# Patient Record
Sex: Male | Born: 1935 | Race: White | Hispanic: No | Marital: Married | Smoking: Never smoker
Health system: Southern US, Community
[De-identification: ages and names within clinical notes are randomized; demographics above are authoritative.]

## PROBLEM LIST (undated history)

## (undated) DIAGNOSIS — I509 Heart failure, unspecified: Secondary | ICD-10-CM

## (undated) DIAGNOSIS — F0390 Unspecified dementia without behavioral disturbance: Secondary | ICD-10-CM

## (undated) DIAGNOSIS — I2510 Atherosclerotic heart disease of native coronary artery without angina pectoris: Secondary | ICD-10-CM

## (undated) DIAGNOSIS — E119 Type 2 diabetes mellitus without complications: Secondary | ICD-10-CM

## (undated) DIAGNOSIS — I252 Old myocardial infarction: Secondary | ICD-10-CM

## (undated) HISTORY — PX: CORONARY ARTERY BYPASS GRAFT: SHX141

---

## 2013-05-27 ENCOUNTER — Emergency Department: Payer: Self-pay | Admitting: Emergency Medicine

## 2013-05-27 LAB — BASIC METABOLIC PANEL
ANION GAP: 7 (ref 7–16)
BUN: 45 mg/dL — ABNORMAL HIGH (ref 7–18)
CHLORIDE: 98 mmol/L (ref 98–107)
CO2: 26 mmol/L (ref 21–32)
CREATININE: 1.55 mg/dL — AB (ref 0.60–1.30)
Calcium, Total: 9.6 mg/dL (ref 8.5–10.1)
Glucose: 75 mg/dL (ref 65–99)
Osmolality: 273 (ref 275–301)
POTASSIUM: 4.6 mmol/L (ref 3.5–5.1)
Sodium: 131 mmol/L — ABNORMAL LOW (ref 136–145)

## 2013-05-27 LAB — CBC WITH DIFFERENTIAL/PLATELET
BASOS PCT: 0.3 %
Basophil #: 0 10*3/uL (ref 0.0–0.1)
EOS PCT: 1.5 %
Eosinophil #: 0.1 10*3/uL (ref 0.0–0.7)
HCT: 37.3 % — ABNORMAL LOW (ref 40.0–52.0)
HGB: 12.7 g/dL — AB (ref 13.0–18.0)
Lymphocyte #: 1.6 10*3/uL (ref 1.0–3.6)
Lymphocyte %: 17.8 %
MCH: 32.6 pg (ref 26.0–34.0)
MCHC: 33.9 g/dL (ref 32.0–36.0)
MCV: 96 fL (ref 80–100)
Monocyte #: 1 x10 3/mm (ref 0.2–1.0)
Monocyte %: 11.5 %
NEUTROS ABS: 6.2 10*3/uL (ref 1.4–6.5)
NEUTROS PCT: 68.9 %
PLATELETS: 285 10*3/uL (ref 150–440)
RBC: 3.88 10*6/uL — ABNORMAL LOW (ref 4.40–5.90)
RDW: 12.8 % (ref 11.5–14.5)
WBC: 9.1 10*3/uL (ref 3.8–10.6)

## 2013-05-27 LAB — URINALYSIS, COMPLETE
Bacteria: NONE SEEN
Bilirubin,UR: NEGATIVE
Blood: NEGATIVE
Glucose,UR: NEGATIVE mg/dL (ref 0–75)
Hyaline Cast: 3
Ketone: NEGATIVE
Leukocyte Esterase: NEGATIVE
Nitrite: NEGATIVE
Ph: 5 (ref 4.5–8.0)
Protein: NEGATIVE
RBC,UR: <1 /HPF (ref 0–5)
Specific Gravity: 1.016 (ref 1.003–1.030)
WBC UR: NONE SEEN /HPF (ref 0–5)

## 2013-05-27 LAB — TROPONIN I

## 2013-05-30 ENCOUNTER — Emergency Department: Payer: Self-pay | Admitting: Emergency Medicine

## 2013-05-30 LAB — COMPREHENSIVE METABOLIC PANEL
ALBUMIN: 3.5 g/dL (ref 3.4–5.0)
ALK PHOS: 55 U/L
ALT: 34 U/L (ref 12–78)
ANION GAP: 5 — AB (ref 7–16)
AST: 23 U/L (ref 15–37)
BUN: 40 mg/dL — ABNORMAL HIGH (ref 7–18)
Bilirubin,Total: 0.4 mg/dL (ref 0.2–1.0)
CALCIUM: 9.7 mg/dL (ref 8.5–10.1)
Chloride: 97 mmol/L — ABNORMAL LOW (ref 98–107)
Co2: 28 mmol/L (ref 21–32)
Creatinine: 1.58 mg/dL — ABNORMAL HIGH (ref 0.60–1.30)
EGFR (African American): 47 — ABNORMAL LOW
EGFR (Non-African Amer.): 40 — ABNORMAL LOW
GLUCOSE: 120 mg/dL — AB (ref 65–99)
OSMOLALITY: 272 (ref 275–301)
Potassium: 5 mmol/L (ref 3.5–5.1)
Sodium: 130 mmol/L — ABNORMAL LOW (ref 136–145)
Total Protein: 7.5 g/dL (ref 6.4–8.2)

## 2013-05-30 LAB — CBC
HCT: 38.8 % — AB (ref 40.0–52.0)
HGB: 12.9 g/dL — AB (ref 13.0–18.0)
MCH: 31.9 pg (ref 26.0–34.0)
MCHC: 33.1 g/dL (ref 32.0–36.0)
MCV: 96 fL (ref 80–100)
PLATELETS: 287 10*3/uL (ref 150–440)
RBC: 4.04 10*6/uL — ABNORMAL LOW (ref 4.40–5.90)
RDW: 13 % (ref 11.5–14.5)
WBC: 8.2 10*3/uL (ref 3.8–10.6)

## 2013-05-30 LAB — TROPONIN I: Troponin-I: <0.02 ng/mL

## 2014-05-23 ENCOUNTER — Ambulatory Visit: Payer: Self-pay | Admitting: Unknown Physician Specialty

## 2014-08-11 ENCOUNTER — Other Ambulatory Visit: Payer: Self-pay | Admitting: Unknown Physician Specialty

## 2014-08-11 DIAGNOSIS — E041 Nontoxic single thyroid nodule: Secondary | ICD-10-CM

## 2015-01-30 ENCOUNTER — Other Ambulatory Visit: Payer: Self-pay | Admitting: Neurology

## 2015-01-30 DIAGNOSIS — R413 Other amnesia: Secondary | ICD-10-CM

## 2015-04-03 ENCOUNTER — Ambulatory Visit
Admission: RE | Admit: 2015-04-03 | Discharge: 2015-04-03 | Disposition: A | Discharge status: Home / Self Care | Payer: Medicare Other | Source: Ambulatory Visit | Attending: Unknown Physician Specialty | Admitting: Unknown Physician Specialty

## 2015-04-03 DIAGNOSIS — E041 Nontoxic single thyroid nodule: Secondary | ICD-10-CM | POA: Diagnosis present

## 2015-04-03 DIAGNOSIS — E042 Nontoxic multinodular goiter: Secondary | ICD-10-CM | POA: Insufficient documentation

## 2015-04-10 ENCOUNTER — Other Ambulatory Visit: Payer: Self-pay | Admitting: Unknown Physician Specialty

## 2015-04-10 DIAGNOSIS — E041 Nontoxic single thyroid nodule: Secondary | ICD-10-CM

## 2015-10-09 ENCOUNTER — Ambulatory Visit: Payer: Medicare Other

## 2016-01-11 ENCOUNTER — Emergency Department: Payer: Medicare Other

## 2016-01-11 ENCOUNTER — Observation Stay
Admission: EM | Admit: 2016-01-11 | Discharge: 2016-01-13 | Disposition: A | Discharge status: Home / Self Care | Payer: Medicare Other | Attending: Internal Medicine | Admitting: Internal Medicine

## 2016-01-11 DIAGNOSIS — Z882 Allergy status to sulfonamides status: Secondary | ICD-10-CM | POA: Diagnosis not present

## 2016-01-11 DIAGNOSIS — I739 Peripheral vascular disease, unspecified: Secondary | ICD-10-CM | POA: Insufficient documentation

## 2016-01-11 DIAGNOSIS — Z951 Presence of aortocoronary bypass graft: Secondary | ICD-10-CM | POA: Diagnosis not present

## 2016-01-11 DIAGNOSIS — L03116 Cellulitis of left lower limb: Secondary | ICD-10-CM | POA: Insufficient documentation

## 2016-01-11 DIAGNOSIS — I081 Rheumatic disorders of both mitral and tricuspid valves: Secondary | ICD-10-CM | POA: Insufficient documentation

## 2016-01-11 DIAGNOSIS — Z8249 Family history of ischemic heart disease and other diseases of the circulatory system: Secondary | ICD-10-CM | POA: Diagnosis not present

## 2016-01-11 DIAGNOSIS — L03112 Cellulitis of left axilla: Secondary | ICD-10-CM | POA: Diagnosis not present

## 2016-01-11 DIAGNOSIS — I110 Hypertensive heart disease with heart failure: Secondary | ICD-10-CM | POA: Insufficient documentation

## 2016-01-11 DIAGNOSIS — E119 Type 2 diabetes mellitus without complications: Secondary | ICD-10-CM | POA: Insufficient documentation

## 2016-01-11 DIAGNOSIS — J189 Pneumonia, unspecified organism: Secondary | ICD-10-CM | POA: Diagnosis not present

## 2016-01-11 DIAGNOSIS — R778 Other specified abnormalities of plasma proteins: Secondary | ICD-10-CM | POA: Diagnosis not present

## 2016-01-11 DIAGNOSIS — W19XXXA Unspecified fall, initial encounter: Secondary | ICD-10-CM | POA: Diagnosis present

## 2016-01-11 DIAGNOSIS — I5022 Chronic systolic (congestive) heart failure: Secondary | ICD-10-CM | POA: Insufficient documentation

## 2016-01-11 DIAGNOSIS — L0390 Cellulitis, unspecified: Secondary | ICD-10-CM | POA: Diagnosis present

## 2016-01-11 DIAGNOSIS — Z23 Encounter for immunization: Secondary | ICD-10-CM | POA: Insufficient documentation

## 2016-01-11 DIAGNOSIS — G309 Alzheimer's disease, unspecified: Secondary | ICD-10-CM | POA: Insufficient documentation

## 2016-01-11 DIAGNOSIS — F0280 Dementia in other diseases classified elsewhere without behavioral disturbance: Secondary | ICD-10-CM | POA: Diagnosis not present

## 2016-01-11 DIAGNOSIS — I252 Old myocardial infarction: Secondary | ICD-10-CM | POA: Diagnosis not present

## 2016-01-11 DIAGNOSIS — E785 Hyperlipidemia, unspecified: Secondary | ICD-10-CM | POA: Diagnosis not present

## 2016-01-11 DIAGNOSIS — R9431 Abnormal electrocardiogram [ECG] [EKG]: Secondary | ICD-10-CM

## 2016-01-11 DIAGNOSIS — W010XXA Fall on same level from slipping, tripping and stumbling without subsequent striking against object, initial encounter: Secondary | ICD-10-CM | POA: Diagnosis not present

## 2016-01-11 DIAGNOSIS — I2510 Atherosclerotic heart disease of native coronary artery without angina pectoris: Secondary | ICD-10-CM | POA: Diagnosis not present

## 2016-01-11 HISTORY — DX: Type 2 diabetes mellitus without complications: E11.9

## 2016-01-11 HISTORY — DX: Unspecified dementia, unspecified severity, without behavioral disturbance, psychotic disturbance, mood disturbance, and anxiety: F03.90

## 2016-01-11 HISTORY — DX: Atherosclerotic heart disease of native coronary artery without angina pectoris: I25.10

## 2016-01-11 HISTORY — DX: Old myocardial infarction: I25.2

## 2016-01-11 LAB — CBC
HCT: 41.6 % (ref 40.0–52.0)
Hemoglobin: 14.3 g/dL (ref 13.0–18.0)
MCH: 32.8 pg (ref 26.0–34.0)
MCHC: 34.5 g/dL (ref 32.0–36.0)
MCV: 95.1 fL (ref 80.0–100.0)
PLATELETS: 171 10*3/uL (ref 150–440)
RBC: 4.38 MIL/uL — ABNORMAL LOW (ref 4.40–5.90)
RDW: 13 % (ref 11.5–14.5)
WBC: 13.3 10*3/uL — AB (ref 3.8–10.6)

## 2016-01-11 LAB — TROPONIN I
TROPONIN I: 0.03 ng/mL — AB (ref <0.03)
Troponin I: 0.03 ng/mL (ref <0.03)
Troponin I: 0.03 ng/mL (ref <0.03)

## 2016-01-11 LAB — URINALYSIS COMPLETE WITH MICROSCOPIC (ARMC ONLY)
BILIRUBIN URINE: NEGATIVE
Bacteria, UA: NONE SEEN
HGB URINE DIPSTICK: NEGATIVE
Ketones, ur: NEGATIVE mg/dL
LEUKOCYTES UA: NEGATIVE
NITRITE: NEGATIVE
Protein, ur: NEGATIVE mg/dL
RBC / HPF: NONE SEEN RBC/hpf (ref 0–5)
SPECIFIC GRAVITY, URINE: 1.01 (ref 1.005–1.030)
Squamous Epithelial / LPF: NONE SEEN
pH: 6 (ref 5.0–8.0)

## 2016-01-11 LAB — BASIC METABOLIC PANEL
Anion gap: 13 (ref 5–15)
BUN: 17 mg/dL (ref 6–20)
CALCIUM: 9.9 mg/dL (ref 8.9–10.3)
CO2: 24 mmol/L (ref 22–32)
CREATININE: 1.05 mg/dL (ref 0.61–1.24)
Chloride: 97 mmol/L — ABNORMAL LOW (ref 101–111)
GFR calc Af Amer: >60 mL/min (ref >60)
Glucose, Bld: 326 mg/dL — ABNORMAL HIGH (ref 65–99)
POTASSIUM: 3.6 mmol/L (ref 3.5–5.1)
SODIUM: 134 mmol/L — AB (ref 135–145)

## 2016-01-11 LAB — GLUCOSE, CAPILLARY
GLUCOSE-CAPILLARY: 214 mg/dL — AB (ref 65–99)
Glucose-Capillary: 291 mg/dL — ABNORMAL HIGH (ref 65–99)

## 2016-01-11 LAB — PROTIME-INR
INR: 1.04
PROTHROMBIN TIME: 13.6 s (ref 11.4–15.2)

## 2016-01-11 LAB — BRAIN NATRIURETIC PEPTIDE: B Natriuretic Peptide: 218 pg/mL — ABNORMAL HIGH (ref 0.0–100.0)

## 2016-01-11 LAB — HEPARIN LEVEL (UNFRACTIONATED): Heparin Unfractionated: <0.1 IU/mL — ABNORMAL LOW (ref 0.30–0.70)

## 2016-01-11 LAB — APTT: aPTT: 29 seconds (ref 24–36)

## 2016-01-11 MED ORDER — FINASTERIDE 5 MG PO TABS
5.0000 mg | ORAL_TABLET | Freq: Every day | ORAL | Status: DC
  Administered 2016-01-12 – 2016-01-13 (×2): 5 mg via ORAL
  Filled 2016-01-11 (×2): qty 1

## 2016-01-11 MED ORDER — METFORMIN HCL ER 500 MG PO TB24
1000.0000 mg | ORAL_TABLET | Freq: Every evening | ORAL | Status: DC
  Administered 2016-01-11 – 2016-01-12 (×2): 1000 mg via ORAL
  Filled 2016-01-11 (×2): qty 2

## 2016-01-11 MED ORDER — FUROSEMIDE 40 MG PO TABS
20.0000 mg | ORAL_TABLET | Freq: Every day | ORAL | Status: DC
  Administered 2016-01-12 – 2016-01-13 (×2): 20 mg via ORAL
  Filled 2016-01-11 (×2): qty 1

## 2016-01-11 MED ORDER — DOXYCYCLINE HYCLATE 100 MG PO TABS
100.0000 mg | ORAL_TABLET | Freq: Two times a day (BID) | ORAL | Status: DC
  Administered 2016-01-11 – 2016-01-13 (×3): 100 mg via ORAL
  Filled 2016-01-11 (×4): qty 1

## 2016-01-11 MED ORDER — METFORMIN HCL ER 500 MG PO TB24
500.0000 mg | ORAL_TABLET | Freq: Every day | ORAL | Status: DC
  Administered 2016-01-12 – 2016-01-13 (×2): 500 mg via ORAL
  Filled 2016-01-11 (×2): qty 1

## 2016-01-11 MED ORDER — CLINDAMYCIN PHOSPHATE 600 MG/50ML IV SOLN
600.0000 mg | Freq: Once | INTRAVENOUS | Status: AC
  Administered 2016-01-11: 600 mg via INTRAVENOUS
  Filled 2016-01-11: qty 50

## 2016-01-11 MED ORDER — DOXAZOSIN MESYLATE 2 MG PO TABS
2.0000 mg | ORAL_TABLET | Freq: Every day | ORAL | Status: DC
  Administered 2016-01-11 – 2016-01-12 (×2): 2 mg via ORAL
  Filled 2016-01-11 (×3): qty 1

## 2016-01-11 MED ORDER — ACETAMINOPHEN 325 MG PO TABS: 650.0000 mg | ORAL_TABLET | Freq: Four times a day (QID) | ORAL | Status: DC | PRN

## 2016-01-11 MED ORDER — HEPARIN BOLUS VIA INFUSION
4000.0000 [IU] | Freq: Once | INTRAVENOUS | Status: AC
  Administered 2016-01-11: 4000 [IU] via INTRAVENOUS
  Filled 2016-01-11: qty 4000

## 2016-01-11 MED ORDER — DOCUSATE SODIUM 100 MG PO CAPS
100.0000 mg | ORAL_CAPSULE | Freq: Every day | ORAL | Status: DC
  Administered 2016-01-12 – 2016-01-13 (×2): 100 mg via ORAL
  Filled 2016-01-11 (×2): qty 1

## 2016-01-11 MED ORDER — SODIUM CHLORIDE 0.9% FLUSH: 3.0000 mL | INTRAVENOUS | Status: DC | PRN

## 2016-01-11 MED ORDER — SODIUM CHLORIDE 0.9% FLUSH
3.0000 mL | Freq: Two times a day (BID) | INTRAVENOUS | Status: DC
  Administered 2016-01-11 – 2016-01-13 (×3): 3 mL via INTRAVENOUS

## 2016-01-11 MED ORDER — BISACODYL 10 MG RE SUPP: 10.0000 mg | Freq: Every day | RECTAL | Status: DC | PRN

## 2016-01-11 MED ORDER — ASPIRIN EC 81 MG PO TBEC
81.0000 mg | DELAYED_RELEASE_TABLET | Freq: Every day | ORAL | Status: DC
  Administered 2016-01-12 – 2016-01-13 (×2): 81 mg via ORAL
  Filled 2016-01-11 (×2): qty 1

## 2016-01-11 MED ORDER — POLYETHYLENE GLYCOL 3350 17 G PO PACK: 17.0000 g | PACK | Freq: Every day | ORAL | Status: DC | PRN

## 2016-01-11 MED ORDER — INFLUENZA VAC SPLIT QUAD 0.5 ML IM SUSY
0.5000 mL | PREFILLED_SYRINGE | INTRAMUSCULAR | Status: AC
  Administered 2016-01-13: 0.5 mL via INTRAMUSCULAR
  Filled 2016-01-11: qty 0.5

## 2016-01-11 MED ORDER — GLIPIZIDE 10 MG PO TABS
10.0000 mg | ORAL_TABLET | Freq: Two times a day (BID) | ORAL | Status: DC
  Administered 2016-01-11 – 2016-01-13 (×4): 10 mg via ORAL
  Filled 2016-01-11 (×4): qty 1

## 2016-01-11 MED ORDER — ONDANSETRON HCL 4 MG PO TABS: 4.0000 mg | ORAL_TABLET | Freq: Four times a day (QID) | ORAL | Status: DC | PRN

## 2016-01-11 MED ORDER — GALANTAMINE HYDROBROMIDE 4 MG PO TABS
12.0000 mg | ORAL_TABLET | Freq: Two times a day (BID) | ORAL | Status: DC
  Administered 2016-01-11 – 2016-01-13 (×4): 12 mg via ORAL
  Filled 2016-01-11 (×5): qty 3

## 2016-01-11 MED ORDER — SPIRONOLACTONE 25 MG PO TABS
12.5000 mg | ORAL_TABLET | Freq: Every day | ORAL | Status: DC
  Administered 2016-01-12 – 2016-01-13 (×2): 12.5 mg via ORAL
  Filled 2016-01-11 (×3): qty 1

## 2016-01-11 MED ORDER — ATORVASTATIN CALCIUM 20 MG PO TABS
20.0000 mg | ORAL_TABLET | Freq: Every day | ORAL | Status: DC
  Administered 2016-01-11 – 2016-01-12 (×2): 20 mg via ORAL
  Filled 2016-01-11 (×2): qty 1

## 2016-01-11 MED ORDER — ONDANSETRON HCL 4 MG/2ML IJ SOLN: 4.0000 mg | Freq: Four times a day (QID) | INTRAMUSCULAR | Status: DC | PRN

## 2016-01-11 MED ORDER — METFORMIN HCL ER 500 MG PO TB24: 500.0000 mg | ORAL_TABLET | Freq: Two times a day (BID) | ORAL | Status: DC

## 2016-01-11 MED ORDER — CITALOPRAM HYDROBROMIDE 20 MG PO TABS
20.0000 mg | ORAL_TABLET | Freq: Every day | ORAL | Status: DC
  Administered 2016-01-12 – 2016-01-13 (×2): 20 mg via ORAL
  Filled 2016-01-11 (×2): qty 1

## 2016-01-11 MED ORDER — ACETAMINOPHEN 650 MG RE SUPP: 650.0000 mg | Freq: Four times a day (QID) | RECTAL | Status: DC | PRN

## 2016-01-11 MED ORDER — SODIUM CHLORIDE 0.9 % IV SOLN: 250.0000 mL | INTRAVENOUS | Status: DC | PRN

## 2016-01-11 MED ORDER — MEMANTINE HCL 5 MG PO TABS
10.0000 mg | ORAL_TABLET | Freq: Two times a day (BID) | ORAL | Status: DC
  Administered 2016-01-12 – 2016-01-13 (×2): 10 mg via ORAL
  Filled 2016-01-11 (×3): qty 2

## 2016-01-11 MED ORDER — INSULIN ASPART 100 UNIT/ML ~~LOC~~ SOLN
0.0000 [IU] | Freq: Three times a day (TID) | SUBCUTANEOUS | Status: DC
  Administered 2016-01-11 – 2016-01-12 (×2): 5 [IU] via SUBCUTANEOUS
  Administered 2016-01-12: 2 [IU] via SUBCUTANEOUS
  Administered 2016-01-12: 7 [IU] via SUBCUTANEOUS
  Administered 2016-01-13: 5 [IU] via SUBCUTANEOUS
  Filled 2016-01-11: qty 5
  Filled 2016-01-11: qty 7
  Filled 2016-01-11 (×2): qty 5
  Filled 2016-01-11: qty 2

## 2016-01-11 MED ORDER — HEPARIN (PORCINE) IN NACL 100-0.45 UNIT/ML-% IJ SOLN
1200.0000 [IU]/h | INTRAMUSCULAR | Status: DC
  Administered 2016-01-11: 1200 [IU]/h via INTRAVENOUS
  Filled 2016-01-11: qty 250

## 2016-01-11 NOTE — Consult Note (Signed)
[facility] Cardiology  CARDIOLOGY CONSULT NOTE  Patient ID: Aaron [name] MRN: [id number] DOB/AGE: 05-05-1929 82 y.o.  Admit date: 01/11/2016 Referring Physician Sudini Primary Physician [facility] Primary Cardiologist  Reason for Consultation Abnormal ECG  HPI: 82 year old gentleman referred for abnormal ECG. The patient has known coronary disease, status post CABG, with history of chronic systolic congestive heart failure. The patient has severe dementia, currently lives with his son. Today, the patient was found laying on the ground and was brought [facility] emergency room. In the emergency room patient was noted to have cellulitis in his left axilla. White count is elevated. EKG revealed sinus rhythm with left ventricular per to feet, and associated ST-T abnormalities inferolaterally. The patient denies chest pain or shortness of breath. Admission labs are notable for negative troponin.  Review of systems complete and found to be negative unless listed above     Past Medical History:  Diagnosis Date  . Coronary artery disease with history of myocardial infarction without history of CABG   . Dementia   . Diabetes mellitus without complication (HCC)     History reviewed. No pertinent surgical history.  Prescriptions Prior to Admission  Medication Sig Dispense Refill Last Dose  . aspirin EC 81 MG tablet Take 162 mg by mouth daily.   01/11/2016 at 0800  . atorvastatin (LIPITOR) 20 MG tablet Take 20 mg by mouth at bedtime.   01/10/2016 at pm  . citalopram (CELEXA) 20 MG tablet Take 20 mg by mouth daily.   01/11/2016 at am  . docusate sodium (COLACE) 100 MG capsule Take 100 mg by mouth daily.   01/11/2016 at am  . doxazosin (CARDURA) 2 MG tablet Take 2 mg by mouth at bedtime.   01/10/2016 at pm  . doxycycline (VIBRAMYCIN) 100 MG capsule Take 100 mg by mouth 2 (two) times daily. For 7 days   01/11/2016 at am  . finasteride (PROSCAR) 5 MG tablet Take 5 mg by mouth daily.   01/11/2016 at am  . furosemide (LASIX)  20 MG tablet Take 20 mg by mouth daily.   01/11/2016 at am  . galantamine (RAZADYNE) 12 MG tablet Take 12 mg by mouth 2 (two) times daily with a meal.   01/11/2016 at am  . glipiZIDE (GLUCOTROL) 10 MG tablet Take 10 mg by mouth 2 (two) times daily before a meal.   01/11/2016 at am  . Melatonin 5 MG TABS Take 5 mg by mouth at bedtime.   01/10/2016 at pm  . memantine (NAMENDA) 10 MG tablet Take 10 mg by mouth 2 (two) times daily.   01/11/2016 at am  . metFORMIN (GLUCOPHAGE-XR) 500 MG 24 hr tablet Take 500-1,000 mg by mouth 2 (two) times daily. 500 mg every morning and 1000 mg at bedtime   01/11/2016 at am  . multivitamin (ONE-A-DAY MEN'S) TABS tablet Take 1 tablet by mouth daily.   01/11/2016 at am  . sitaGLIPtin (JANUVIA) 100 MG tablet Take 100 mg by mouth daily.   01/11/2016 at am  . spironolactone (ALDACTONE) 25 MG tablet Take 12.5 mg by mouth daily.   01/11/2016 at am   Social History   Social History  . Marital status: Married    Spouse name: N/A  . Number of children: N/A  . Years of education: N/A   Occupational History  . Not on file.   Social History Main Topics  . Smoking status: Never Smoker  . Smokeless tobacco: Never Used  . Alcohol use No  . Drug use: Unknown  .  Sexual activity: Not on file   Other Topics Concern  . Not on file   Social History Narrative  . No narrative on file    No family history on file.    Review of systems complete and found to be negative unless listed above      PHYSICAL EXAM  General: Well developed, well nourished, in no acute distress HEENT:  Normocephalic and atramatic Neck:  No JVD.  Lungs: Clear bilaterally to auscultation and percussion. Heart: HRRR . Normal S1 and S2 without gallops or murmurs.  Abdomen: Bowel sounds are positive, abdomen soft and non-tender  Msk:  Back normal, normal gait. Normal strength and tone for age. Extremities: No clubbing, cyanosis or edema.   Neuro: Alert and oriented X 3. Psych:  Good affect, responds  appropriately  Labs:   Lab Results  Component Value Date   WBC 13.3 (H) 01/11/2016   HGB 14.3 01/11/2016   HCT 41.6 01/11/2016   MCV 95.1 01/11/2016   PLT 171 01/11/2016    Recent Labs Lab 01/11/16 1034  NA 134*  K 3.6  CL 97*  CO2 24  BUN 17  CREATININE 1.05  CALCIUM 9.9  GLUCOSE 326*   Lab Results  Component Value Date   TROPONINI 0.03 (HH) 01/11/2016   No results found for: CHOL No results found for: HDL No results found for: LDLCALC No results found for: TRIG No results found for: CHOLHDL No results found for: LDLDIRECT    Radiology: Dg Chest 2 View  Result Date: 01/11/2016 CLINICAL DATA:  Shortness of breath with CHF and pneumonia. EXAM: CHEST  2 VIEW COMPARISON:  None. FINDINGS: The lungs are clear wiithout focal pneumonia, edema, pneumothorax or pleural effusion. Interstitial markings are diffusely coarsened with chronic features. Cardiopericardial silhouette is at upper limits of normal for size. Patient is status post CABG. The visualized bony structures of the thorax are intact. Telemetry leads overlie the chest. IMPRESSION: No active cardiopulmonary disease. Electronically Signed   By: Kennith CenterEric  Mansell M.D.   On: 01/11/2016 11:47   Ct Head Wo Contrast  Result Date: 01/11/2016 CLINICAL DATA:  Multiple falls.  Altered mental status. EXAM: CT HEAD WITHOUT CONTRAST TECHNIQUE: Contiguous axial images were obtained from the base of the skull through the vertex without intravenous contrast. COMPARISON:  None. FINDINGS: Brain: No evidence of acute infarction, hemorrhage, extra-axial collection, ventriculomegaly, or mass effect. Generalized cerebral atrophy. Periventricular white matter low attenuation likely secondary to microangiopathy. Vascular: Cerebrovascular atherosclerotic calcifications are noted. Skull: Negative for fracture or focal lesion. Sinuses/Orbits: Visualized portions of the orbits are unremarkable. Visualized portions of the paranasal sinuses and mastoid air  cells are unremarkable. Other: None. IMPRESSION: No acute intracranial pathology. Electronically Signed   By: Elige KoHetal  Patel   On: 01/11/2016 11:47    EKG: Normal sinus rhythm, LVH, with repolarization abnormality  ASSESSMENT AND PLAN:   1. Abnormal ECG, with a pH with repolarization abnormalities, in the absence of chest pain, with negative troponin 2. CAD, status post CABG, currently without chest pain 3. Cellulitis 4. Dementia 5. DO NOT RESUSCITATE  Recommendations  1. DC heparin 2. Continue antibiotic therapy for cellulitis 3. Defer cardiac diagnostics at this time    Signed: Marchia Diguglielmo MD,PhD, [facility] 01/11/2016, 4:36 PM

## 2016-01-11 NOTE — ED Notes (Signed)
Pt given snacks. Called dietary to get lunch trays sent up to ED

## 2016-01-11 NOTE — Progress Notes (Signed)
ANTICOAGULATION CONSULT NOTE - Initial Consult  Pharmacy Consult for Heparin Indication: chest pain/ACS  Allergies  Allergen Reactions  . Sulfa Antibiotics     Patient Measurements: Height: 5\' 10"  (177.8 cm) (Family Reported) Weight: 212 lb (96.2 kg) (Family Reported) IBW/kg (Calculated) : 73 Heparin Dosing Weight: 92.7  Vital Signs: Temp: 98.7 F (37.1 C) (09/15 1031) Temp Source: Oral (09/15 1031) BP: 153/67 (09/15 1031) Pulse Rate: 86 (09/15 1031)  Labs:  Recent Labs  01/11/16 1034  HGB 14.3  HCT 41.6  PLT 171  APTT 29  LABPROT 13.6  INR 1.04  CREATININE 1.05  TROPONINI 0.03*    Estimated Creatinine Clearance: 61 mL/min (by C-G formula based on SCr of 1.05 mg/dL).   Medical History: Past Medical History:  Diagnosis Date  . Coronary artery disease with history of myocardial infarction without history of CABG   . Dementia   . Diabetes mellitus without complication (HCC)     Medications:   (Not in a hospital admission) Scheduled:    Assessment: 82 y/o M with known h/o CABG admitted with possible ACS.   Goal of Therapy:  Heparin level 0.3-0.7 units/ml Monitor platelets by anticoagulation protocol: Yes   Plan:  Give 4000 units bolus x 1 Start heparin infusion at 1200 units/hr Check anti-Xa level in 8 hours and daily while on heparin Continue to monitor H&H and platelets  Luisa HartChristy, Malayshia All D 01/11/2016,12:36 PM

## 2016-01-11 NOTE — ED Provider Notes (Signed)
[facility] Emergency Department Provider Note    First MD Initiated Contact with Patient 01/11/16 1030     (approximate)  I have reviewed the triage vital signs and the nursing notes.   HISTORY  Chief Complaint Altered Mental Status    HPI Aaron SaugerRalph [name] is a 82 y.o. male patient with a history of CAD and as well as dementia and diabetes presents after an unwitnessed fall per patient found out in his grass and was acutely altered and diaphoretic. No complaint of chest pain or shortness of breath. No evidence of head injury. Patient was recently seen for cellulitis of the lower extremity and started on doxycycline. Overnight they've noticed increasing redness and a new area of fluctuance in the left axilla. Stating that he's having unsteady gait and more altered. Patient is severely demented limiting history but he denies any discomfort at this time   Past Medical History:  Diagnosis Date  . Coronary artery disease with history of myocardial infarction without history of CABG   . Dementia   . Diabetes mellitus without complication (HCC)     There are no active problems to display for this patient.   History reviewed. No pertinent surgical history.  Prior to Admission medications   Not on File    Allergies Sulfa antibiotics  No family history on file. Unabkle to assess 2/2 dementia  Social History Social History  Substance Use Topics  . Smoking status: Never Smoker  . Smokeless tobacco: Never Used  . Alcohol use No    Review of Systems Patient denies headaches, rhinorrhea, blurry vision, numbness, shortness of breath, chest pain, edema, cough, abdominal pain, nausea, vomiting, diarrhea, dysuria, fevers, rashes or hallucinations unless otherwise stated above in HPI. ____________________________________________   PHYSICAL EXAM:  VITAL SIGNS: Vitals:   01/11/16 1031  BP: (!) 153/67  Pulse: 86  Resp: 19  Temp: 98.7 F (37.1 C)     Constitutional: Alert and oriented. Pleasant elderly male in no acute distress Eyes: Conjunctivae are normal. PERRL. EOMI. Head: Atraumatic. Nose: No congestion/rhinnorhea. Mouth/Throat: Mucous membranes are moist.  Oropharynx non-erythematous. Neck: No stridor. Painless ROM. No cervical spine tenderness to palpation Hematological/Lymphatic/Immunilogical: No cervical lymphadenopathy. Cardiovascular: Normal rate, regular rhythm. Grossly normal heart sounds.  Good peripheral circulation. Respiratory: Normal respiratory effort.  No retractions. Lungs CTAB. Gastrointestinal: Soft and nontender. No distention. No abdominal bruits. No CVA tenderness. Genitourinary:  Musculoskeletal: No lower extremity tenderness nor edema.  No joint effusions. Neurologic:  Normal speech and language. No gross focal neurologic deficits are appreciated. No gait instability. Skin:  Skin is warm, dry and intact. Recently her area of erythema to the left shin. A 4-5 cm area of fluctuance and tenderness with overlying cellulitis of the left axilla.   ____________________________________________   LABS (all labs ordered are listed, but only abnormal results are displayed)  Results for orders placed or performed during the hospital encounter of 01/11/16 (from the past 24 hour(s))  Basic metabolic panel     Status: Abnormal   Collection Time: 01/11/16 10:34 AM  Result Value Ref Range   Sodium 134 (L) 135 - 145 mmol/L   Potassium 3.6 3.5 - 5.1 mmol/L   Chloride 97 (L) 101 - 111 mmol/L   CO2 24 22 - 32 mmol/L   Glucose, Bld 326 (H) 65 - 99 mg/dL   BUN 17 6 - 20 mg/dL   Creatinine, Ser 1.191.05 0.61 - 1.24 mg/dL   Calcium 9.9 8.9 - 14.710.3 mg/dL  GFR calc non Af Amer >60 >60 mL/min   GFR calc Af Amer >60 >60 mL/min   Anion gap 13 5 - 15  CBC     Status: Abnormal   Collection Time: 01/11/16 10:34 AM  Result Value Ref Range   WBC 13.3 (H) 3.8 - 10.6 K/uL   RBC 4.38 (L) 4.40 - 5.90 MIL/uL   Hemoglobin 14.3 13.0 -  18.0 g/dL   HCT 16.1 09.6 - 04.5 %   MCV 95.1 80.0 - 100.0 fL   MCH 32.8 26.0 - 34.0 pg   MCHC 34.5 32.0 - 36.0 g/dL   RDW 40.9 81.1 - 91.4 %   Platelets 171 150 - 440 K/uL  Urinalysis complete, with microscopic     Status: Abnormal   Collection Time: 01/11/16 10:38 AM  Result Value Ref Range   Color, Urine YELLOW (A) YELLOW   APPearance CLEAR (A) CLEAR   Glucose, UA >500 (A) NEGATIVE mg/dL   Bilirubin Urine NEGATIVE NEGATIVE   Ketones, ur NEGATIVE NEGATIVE mg/dL   Specific Gravity, Urine 1.010 1.005 - 1.030   Hgb urine dipstick NEGATIVE NEGATIVE   pH 6.0 5.0 - 8.0   Protein, ur NEGATIVE NEGATIVE mg/dL   Nitrite NEGATIVE NEGATIVE   Leukocytes, UA NEGATIVE NEGATIVE   RBC / HPF NONE SEEN 0 - 5 RBC/hpf   WBC, UA 0-5 0 - 5 WBC/hpf   Bacteria, UA NONE SEEN NONE SEEN   Squamous Epithelial / LPF NONE SEEN NONE SEEN   Mucous PRESENT    ____________________________________________  EKG My review and personal interpretation at Time: 10:32   Indication: syncope  Rate: 85  Rhythm: sinus Axis: normal Other: deep ST depression in infero lateral distribution, J point elevation in aVR, V1 and V2. ____________________________________________  RADIOLOGY  CT head with NAICA ____________________________________________   PROCEDURES  Procedure(s) performed: none    Critical Care performed: yes CRITICAL CARE Performed by: Willy Eddy   Total critical care time: 35 minutes  Critical care time was exclusive of separately billable procedures and treating other patients.  Critical care was necessary to treat or prevent imminent or life-threatening deterioration.  Critical care was time spent personally by me on the following activities: development of treatment plan with patient and/or surrogate as well as nursing, discussions with consultants, evaluation of patient's response to treatment, examination of patient, obtaining history from patient or surrogate, ordering and  performing treatments and interventions, ordering and review of laboratory studies, ordering and review of radiographic studies, pulse oximetry and re-evaluation of patient's condition.  ____________________________________________   INITIAL IMPRESSION / ASSESSMENT AND PLAN / ED COURSE  Pertinent labs & imaging results that were available during my care of the patient were reviewed by me and considered in my medical decision making (see chart for details).  DDX: cva, acs, dysrhythmia, dehydration, sepsis  Leilan Bochenek is a 82 y.o. who presents to the ED with unwitnessed fall and found diaphoretic and altered. Patient with significant heart history. Denies any chest pain at this time based on his history and concerning presentation will perform cardiac workup. Will order head CT due to unwitnessed fall. His history is limited due to dementia.  Does have evidence of cellulitis and large fluctuant area in the left axilla no evidence of abscess on bedside ultrasound. Will start IV Clinda  Clinical Course  Comment By Time  Repeat EKG does show dynamic changes, still no STEMI criteria.  She adamantly denies any chest pain. Willy Eddy, MD 09/15 1158  Troponin  0.03 Willy Eddy, MD 09/15 1204  Given his dynamic EKG changes we'll start patient on heparin as I am concerned that he had a primary cardiac event today. CT head is normal chest x-ray without any focal abnormalities. Willy Eddy, MD 09/15 1218   Unable to fully characterize whether this was a syncopal event due to the patient's dementia but given his concerning history and acute presentation that is high on my differential. Patient is otherwise hemodynamically stable. We'll admit to hospitalist for further evaluation and management.  ____________________________________________   FINAL CLINICAL IMPRESSION(S) / ED DIAGNOSES  Final diagnoses:  Fall from standing, initial encounter  Abnormal finding on EKG  Cellulitis,  unspecified cellulitis site, unspecified extremity site, unspecified laterality      NEW MEDICATIONS STARTED DURING THIS VISIT:  New Prescriptions   No medications on file     Note:  This document was prepared using Dragon voice recognition software and may include unintentional dictation errors.    Willy Eddy, MD 01/11/16 1300

## 2016-01-11 NOTE — H&P (Signed)
[facility] [place] at [facility]   [facility]: [name]    MR#:  [id number]  DATE OF BIRTH:  1929/12/27  DATE OF ADMISSION:  01/11/2016  PRIMARY CARE PHYSICIAN: Danella PentonMark F Miller, MD   REQUESTING/REFERRING PHYSICIAN: Dr. Roxan Hockeyobinson  CHIEF COMPLAINT:   Chief Complaint  Patient presents with  . Altered Mental Status    HISTORY OF PRESENT ILLNESS:  Aaron [name] Robar  is a 82 y.o. male with a known history of Chronic systolic CHF, hypertension, dementia presented to the hospital brought in by family after he was found on the ground by neighbors. Patient has severe dementia and mentions that he slipped and fell due dew on the ground. He did have incontinence of bowel. No tongue bite. No witnessed seizures. Here in the EKG patient has been found to have some dynamic EKG changes. Troponin of 0.03 and is being admitted on heparin drip after discussing with cardiology. Patient was seen yesterday by his primary care physician for left leg and left axillary cellulitis and started on doxycycline. This is improving.  PAST MEDICAL HISTORY:   Past Medical History:  Diagnosis Date  . Coronary artery disease with history of myocardial infarction without history of CABG   . Dementia   . Diabetes mellitus without complication (HCC)     PAST SURGICAL HISTORY:  History reviewed. No pertinent surgical history.  SOCIAL HISTORY:   Social History  Substance Use Topics  . Smoking status: Never Smoker  . Smokeless tobacco: Never Used  . Alcohol use No    FAMILY HISTORY:  No family history on file. CAD  DRUG ALLERGIES:   Allergies  Allergen Reactions  . Sulfa Antibiotics     REVIEW OF SYSTEMS:   Review of Systems  Unable to perform ROS: Dementia    MEDICATIONS AT HOME:   Prior to Admission medications   Medication Sig Start Date End Date Taking? Authorizing Provider  aspirin EC 81 MG tablet Take 162 mg by mouth daily.   Yes Historical Provider, MD   atorvastatin (LIPITOR) 20 MG tablet Take 20 mg by mouth at bedtime.   Yes Historical Provider, MD  citalopram (CELEXA) 20 MG tablet Take 20 mg by mouth daily.   Yes Historical Provider, MD  docusate sodium (COLACE) 100 MG capsule Take 100 mg by mouth daily.   Yes Historical Provider, MD  doxazosin (CARDURA) 2 MG tablet Take 2 mg by mouth at bedtime.   Yes Historical Provider, MD  doxycycline (VIBRAMYCIN) 100 MG capsule Take 100 mg by mouth 2 (two) times daily. For 7 days 01/10/16 01/17/16 Yes Historical Provider, MD  finasteride (PROSCAR) 5 MG tablet Take 5 mg by mouth daily.   Yes Historical Provider, MD  furosemide (LASIX) 20 MG tablet Take 20 mg by mouth daily.   Yes Historical Provider, MD  galantamine (RAZADYNE) 12 MG tablet Take 12 mg by mouth 2 (two) times daily with a meal.   Yes Historical Provider, MD  glipiZIDE (GLUCOTROL) 10 MG tablet Take 10 mg by mouth 2 (two) times daily before a meal.   Yes Historical Provider, MD  Melatonin 5 MG TABS Take 5 mg by mouth at bedtime.   Yes Historical Provider, MD  memantine (NAMENDA) 10 MG tablet Take 10 mg by mouth 2 (two) times daily.   Yes Historical Provider, MD  metFORMIN (GLUCOPHAGE-XR) 500 MG 24 hr tablet Take 500-1,000 mg by mouth 2 (two) times daily. 500 mg every morning and 1000 mg at bedtime  Yes Historical Provider, MD  multivitamin (ONE-A-DAY MEN'S) TABS tablet Take 1 tablet by mouth daily.   Yes Historical Provider, MD  sitaGLIPtin (JANUVIA) 100 MG tablet Take 100 mg by mouth daily.   Yes Historical Provider, MD  spironolactone (ALDACTONE) 25 MG tablet Take 12.5 mg by mouth daily.   Yes Historical Provider, MD     VITAL SIGNS:  Blood pressure (!) 153/67, pulse 86, temperature 98.7 F (37.1 C), temperature source Oral, resp. rate 19, height 5\' 10"  (1.778 m), weight 96.2 kg (212 lb), SpO2 90 %.  PHYSICAL EXAMINATION:  Physical Exam  GENERAL:  82 y.o.-year-old patient lying in the bed with no acute distress. Obese EYES: Pupils  equal, round, reactive to light and accommodation. No scleral icterus. Extraocular muscles intact.  HEENT: Head atraumatic, normocephalic. Oropharynx and nasopharynx clear. No oropharyngeal erythema, moist oral mucosa  NECK:  Supple, no jugular venous distention. No thyroid enlargement, no tenderness.  LUNGS: Normal breath sounds bilaterally, no wheezing, rales, rhonchi. No use of accessory muscles of respiration.  CARDIOVASCULAR: S1, S2 normal. No murmurs, rubs, or gallops.  ABDOMEN: Soft, nontender, nondistended. Bowel sounds present. No organomegaly or mass.  EXTREMITIES: No pedal edema, cyanosis, or clubbing. + 2 pedal & radial pulses b/l.   NEUROLOGIC: Cranial nerves II through XII are intact. No focal Motor or sensory deficits appreciated b/l PSYCHIATRIC: The patient is alert and oriented x 3. Good affect.  SKIN: Erythema left leg and left axillary area. From swelling left axillary area is mobile and not fluctuant  LABORATORY PANEL:   CBC  Recent Labs Lab 01/11/16 1034  WBC 13.3*  HGB 14.3  HCT 41.6  PLT 171   ------------------------------------------------------------------------------------------------------------------  Chemistries   Recent Labs Lab 01/11/16 1034  NA 134*  K 3.6  CL 97*  CO2 24  GLUCOSE 326*  BUN 17  CREATININE 1.05  CALCIUM 9.9   ------------------------------------------------------------------------------------------------------------------  Cardiac Enzymes  Recent Labs Lab 01/11/16 1034  TROPONINI 0.03*   ------------------------------------------------------------------------------------------------------------------  RADIOLOGY:  Dg Chest 2 View  Result Date: 01/11/2016 CLINICAL DATA:  Shortness of breath with CHF and pneumonia. EXAM: CHEST  2 VIEW COMPARISON:  None. FINDINGS: The lungs are clear wiithout focal pneumonia, edema, pneumothorax or pleural effusion. Interstitial markings are diffusely coarsened with chronic features.  Cardiopericardial silhouette is at upper limits of normal for size. Patient is status post CABG. The visualized bony structures of the thorax are intact. Telemetry leads overlie the chest. IMPRESSION: No active cardiopulmonary disease. Electronically Signed   By: Kennith Center M.D.   On: 01/11/2016 11:47   Ct Head Wo Contrast  Result Date: 01/11/2016 CLINICAL DATA:  Multiple falls.  Altered mental status. EXAM: CT HEAD WITHOUT CONTRAST TECHNIQUE: Contiguous axial images were obtained from the base of the skull through the vertex without intravenous contrast. COMPARISON:  None. FINDINGS: Brain: No evidence of acute infarction, hemorrhage, extra-axial collection, ventriculomegaly, or mass effect. Generalized cerebral atrophy. Periventricular white matter low attenuation likely secondary to microangiopathy. Vascular: Cerebrovascular atherosclerotic calcifications are noted. Skull: Negative for fracture or focal lesion. Sinuses/Orbits: Visualized portions of the orbits are unremarkable. Visualized portions of the paranasal sinuses and mastoid air cells are unremarkable. Other: None. IMPRESSION: No acute intracranial pathology. Electronically Signed   By: Elige Ko   On: 01/11/2016 11:47     IMPRESSION AND PLAN:   * Fall Etiology unclear. Patient has dementia and is a poor historian. Has some EKG changes. Syncope? Case discussed with Dr. Adrienne Mocha. Heparin started. Will order echo. Cath?  ASA, Statin  * Left leg and left axillary cellulitis Continue doxycycline  * HTN Continue home meds  *  Chronic Systolic CHF Continue Lasix  * Dementia Watch for inpatient delirium  * DVT prophylaxis On heparin drip  All the records are reviewed and case discussed with ED provider. Management plans discussed with the patient, family and they are in agreement.  CODE STATUS: FULL CODE  TOTAL TIME TAKING CARE OF THIS PATIENT: 40 minutes.   Milagros Loll R M.D on 01/11/2016 at 1:13 PM  Between 7am  to 6pm - Pager - [phone]  After 6pm go to [email or web address] - password [email or web address]  [facility]  Office  [phone]  CC: Primary care physician; Danella Penton, MD  Note: This dictation was prepared with Dragon dictation along with smaller phrase technology. Any transcriptional errors that result from this process are unintentional.

## 2016-01-11 NOTE — ED Triage Notes (Signed)
Pt came to ED from home via EMS. Per family, reports pt was clammy and diaphoretic this morning. Started on doxycyline for cellulitis under armit. Per family, getting worse and more red. Pt has history of dementia.

## 2016-01-12 ENCOUNTER — Observation Stay
Admit: 2016-01-12 | Discharge: 2016-01-12 | Disposition: A | Discharge status: Home / Self Care | Payer: Medicare Other | Attending: Internal Medicine | Admitting: Internal Medicine

## 2016-01-12 LAB — ECHOCARDIOGRAM COMPLETE
AV VEL mean LVOT/AV: 0.68
AV area mean vel ind: 0.98 cm2/m2
AV peak Index: 1.07
AV pk vel: 159 cm/s
AV vel: 2.42
AVAREAMEANV: 2.14 cm2
AVAREAVTI: 2.35 cm2
AVAREAVTIIND: 1.1 cm2/m2
AVG: 6 mmHg
AVLVOTPG: 6 mmHg
AVPG: 10 mmHg
Ao pk vel: 0.75 m/s
CHL CUP AV VALUE AREA INDEX: 1.1
DOP CAL AO MEAN VELOCITY: 118 cm/s
EERAT: 10.7
EWDT: 180 ms
FS: 30 % (ref 28–44)
Height: 70 in
IV/PV OW: 1
LA diam index: 2.28 cm/m2
LA vol A4C: 90 ml
LA vol index: 37.4 mL/m2
LA vol: 82 mL
LASIZE: 50 mm
LDCA: 3.14 cm2
LEFT ATRIUM END SYS DIAM: 50 mm
LV E/e'average: 10.7
LV PW d: 14 mm — AB (ref 0.6–1.1)
LV TDI E'LATERAL: 10
LV e' LATERAL: 10 cm/s
LVEEMED: 10.7
LVOT SV: 71 mL
LVOT VTI: 22.6 cm
LVOT peak vel: 119 cm/s
LVOTD: 20 mm
LVOTVTI: 0.77 cm
MV Dec: 180
MV pk A vel: 92.1 m/s
MVPG: 5 mmHg
MVPKEVEL: 107 m/s
RV LATERAL S' VELOCITY: 8.77 cm/s
TDI e' medial: 4.78
VTI: 29.3 cm
Valve area: 2.42 cm2
Weight: 3352 oz

## 2016-01-12 LAB — CBC
HEMATOCRIT: 41.6 % (ref 40.0–52.0)
Hemoglobin: 14.3 g/dL (ref 13.0–18.0)
MCH: 32.9 pg (ref 26.0–34.0)
MCHC: 34.4 g/dL (ref 32.0–36.0)
MCV: 95.7 fL (ref 80.0–100.0)
PLATELETS: 174 10*3/uL (ref 150–440)
RBC: 4.34 MIL/uL — ABNORMAL LOW (ref 4.40–5.90)
RDW: 13.1 % (ref 11.5–14.5)
WBC: 10.6 10*3/uL (ref 3.8–10.6)

## 2016-01-12 LAB — GLUCOSE, CAPILLARY
GLUCOSE-CAPILLARY: 311 mg/dL — AB (ref 65–99)
GLUCOSE-CAPILLARY: 180 mg/dL — AB (ref 65–99)
Glucose-Capillary: 291 mg/dL — ABNORMAL HIGH (ref 65–99)
Glucose-Capillary: 222 mg/dL — ABNORMAL HIGH (ref 65–99)

## 2016-01-12 LAB — BASIC METABOLIC PANEL
Anion gap: 10 (ref 5–15)
BUN: 18 mg/dL (ref 6–20)
CALCIUM: 9.6 mg/dL (ref 8.9–10.3)
CO2: 26 mmol/L (ref 22–32)
Chloride: 101 mmol/L (ref 101–111)
Creatinine, Ser: 0.92 mg/dL (ref 0.61–1.24)
GFR calc Af Amer: >60 mL/min (ref >60)
GLUCOSE: 233 mg/dL — AB (ref 65–99)
Potassium: 3.9 mmol/L (ref 3.5–5.1)
Sodium: 137 mmol/L (ref 135–145)

## 2016-01-12 NOTE — Progress Notes (Signed)
[facility] - [place] at [facility]   PATIENT NAME: Aaron [name]    MR#:  [id number]  DATE OF BIRTH:  08-30-1929  SUBJECTIVE : comes for a fall at home.seen at bedside. admitted for cellulitis of the left leg, infected hair follicle in the left axilla. Patient is presently confused but denies any complaints except the pain in the left axilla. No chest pain or shortness of breath.   CHIEF COMPLAINT:   Chief Complaint  Patient presents with  . Altered Mental Status    REVIEW OF SYSTEMS:   ROS CONSTITUTIONAL: No fever, fatigue or weakness.  EYES: No blurred or double vision.  EARS, NOSE, AND THROAT: No tinnitus or ear pain.  RESPIRATORY: No cough, shortness of breath, wheezing or hemoptysis.  CARDIOVASCULAR: No chest pain, orthopnea, edema.  GASTROINTESTINAL: No nausea, vomiting, diarrhea or abdominal pain.  GENITOURINARY: No dysuria, hematuria.  ENDOCRINE: No polyuria, nocturia,  HEMATOLOGY: No anemia, easy bruising or bleeding SKIN:Patient has infection in the left axilla, small infected area of left leg.left axilla boil,,tender to palpation without evidence of abscess. MUSCULOSKELETAL: No joint pain or arthritis.   NEUROLOGIC: No tingling, numbness, weakness.  PSYCHIATRY: No anxiety or depression.   DRUG ALLERGIES:   Allergies  Allergen Reactions  . Sulfa Antibiotics     VITALS:  Blood pressure (!) 148/51, pulse 60, temperature 98.1 F (36.7 C), temperature source Oral, resp. rate 16, height 5\' 10"  (1.778 m), weight 95 kg (209 lb 8 oz), SpO2 92 %.  PHYSICAL EXAMINATION:  GENERAL:  82 y.o.-year-old patient lying in the bed with no acute distress.  EYES: Pupils equal, round, reactive to light and accommodation. No scleral icterus. Extraocular muscles intact.  HEENT: Head atraumatic, normocephalic. Oropharynx and nasopharynx clear.  NECK:  Supple, no jugular venous distention. No thyroid enlargement, no tenderness.  LUNGS: Normal breath sounds  bilaterally, no wheezing, rales,rhonchi or crepitation. No use of accessory muscles of respiration.  CARDIOVASCULAR: S1, S2 normal. No murmurs, rubs, or gallops.  ABDOMEN: Soft, nontender, nondistended. Bowel sounds present. No organomegaly or mass.  EXTREMITIES: No pedal edema, cyanosis, or clubbing. Left leg has small erythematous papule / NEUROLOGIC: Cranial nerves II through XII are intact. Muscle strength 5/5 in all extremities. Sensation intact. Gait not checked.  PSYCHIATRIC: The patient is alert and oriented x 3.  SKIN: No obvious rash, lesion, or ulcer.    LABORATORY PANEL:   CBC  Recent Labs Lab 01/12/16 0444  WBC 10.6  HGB 14.3  HCT 41.6  PLT 174   ------------------------------------------------------------------------------------------------------------------  Chemistries   Recent Labs Lab 01/12/16 0444  NA 137  K 3.9  CL 101  CO2 26  GLUCOSE 233*  BUN 18  CREATININE 0.92  CALCIUM 9.6   ------------------------------------------------------------------------------------------------------------------  Cardiac Enzymes  Recent Labs Lab 01/11/16 1952  TROPONINI 0.03*   ------------------------------------------------------------------------------------------------------------------  RADIOLOGY:  Dg Chest 2 View  Result Date: 01/11/2016 CLINICAL DATA:  Shortness of breath with CHF and pneumonia. EXAM: CHEST  2 VIEW COMPARISON:  None. FINDINGS: The lungs are clear wiithout focal pneumonia, edema, pneumothorax or pleural effusion. Interstitial markings are diffusely coarsened with chronic features. Cardiopericardial silhouette is at upper limits of normal for size. Patient is status post CABG. The visualized bony structures of the thorax are intact. Telemetry leads overlie the chest. IMPRESSION: No active cardiopulmonary disease. Electronically Signed   By: Kennith Center M.D.   On: 01/11/2016 11:47   Ct Head Wo Contrast  Result Date: 01/11/2016 CLINICAL  DATA:  Multiple  falls.  Altered mental status. EXAM: CT HEAD WITHOUT CONTRAST TECHNIQUE: Contiguous axial images were obtained from the base of the skull through the vertex without intravenous contrast. COMPARISON:  None. FINDINGS: Brain: No evidence of acute infarction, hemorrhage, extra-axial collection, ventriculomegaly, or mass effect. Generalized cerebral atrophy. Periventricular white matter low attenuation likely secondary to microangiopathy. Vascular: Cerebrovascular atherosclerotic calcifications are noted. Skull: Negative for fracture or focal lesion. Sinuses/Orbits: Visualized portions of the orbits are unremarkable. Visualized portions of the paranasal sinuses and mastoid air cells are unremarkable. Other: None. IMPRESSION: No acute intracranial pathology. Electronically Signed   By: Elige KoHetal  Patel   On: 01/11/2016 11:47    EKG:   Orders placed or performed during the hospital encounter of 01/11/16  . ED EKG  . ED EKG  . ED EKG  . ED EKG  . EKG 12-Lead  . EKG 12-Lead    ASSESSMENT AND PLAN:   #1 left leg, left axilla infection: Continue doxycycline.  monitored for further improvement. Likely discharge tomorrow. #2 history of fall; current wife patient usually never falls at home. But we will get physical therapy evaluation.just to make sure he is stable to go home. #3 3. slightly elevated troponins without any chest pain. No further cardiac intervention is needed. #.4.DMII; Nitro-Bid sugar up to 291. Continue glipizide, metformin, add continue oral  meds,SSI D/w wife D/w RN  All the records are reviewed and case discussed with Care Management/Social Workerr. Management plans discussed with the patient, family and they are in agreement.  CODE STATUS:full  TOTAL TIME TAKING CARE OF THIS PATIENT: 35 minutes.   POSSIBLE D/C IN 1-2DAYS, DEPENDING ON CLINICAL CONDITION.   Katha HammingKONIDENA,Telissa Palmisano M.D on 01/12/2016 at 11:24 AM  Between 7am to 6pm - Pager - [phone]  After 6pm go  to [email or web address] - password [email or web address]  [facility]  Office  [phone]  CC: Primary care physician; Danella PentonMark F Miller, MD   Note: This dictation was prepared with Dragon dictation along with smaller phrase technology. Any transcriptional errors that result from this process are unintentional.

## 2016-01-13 LAB — GLUCOSE, CAPILLARY
GLUCOSE-CAPILLARY: 259 mg/dL — AB (ref 65–99)
GLUCOSE-CAPILLARY: 277 mg/dL — AB (ref 65–99)

## 2016-01-13 NOTE — Discharge Instructions (Signed)
Confusion Confusion is the inability to think with your usual speed or clarity. Confusion may come on quickly or slowly over time. How quickly the confusion comes on depends on the cause. Confusion can be due to any number of causes. CAUSES   Concussion, head injury, or head trauma.  Seizures.  Stroke.  Fever.  Brain tumor.  Age related decreased brain function (dementia).  Heightened emotional states like rage or terror.  Mental illness in which the person loses the ability to determine what is real and what is not (hallucinations).  Infections such as a urinary tract infection (UTI).  Toxic effects from alcohol, drugs, or prescription medicines.  Dehydration and an imbalance of salts in the body (electrolytes).  Lack of sleep.  Low blood sugar (diabetes).  Low levels of oxygen from conditions such as chronic lung disorders.  Drug interactions or other medicine side effects.  Nutritional deficiencies, especially niacin, thiamine, vitamin C, or vitamin B.  Sudden drop in body temperature (hypothermia).  Change in routine, such as when traveling or hospitalized. SIGNS AND SYMPTOMS  People often describe their thinking as cloudy or unclear when they are confused. Confusion can also include feeling disoriented. That means you are unaware of where or who you are. You may also not know what the date or time is. If confused, you may also have difficulty paying attention, remembering, and making decisions. Some people also act aggressively when they are confused.  DIAGNOSIS  The medical evaluation of confusion may include:  Blood and urine tests.  X-rays.  Brain and nervous system tests.  Analyzing your brain waves (electroencephalogram or EEG).  Magnetic resonance imaging (MRI) of your head.  Computed tomography (CT) scan of your head.  Mental status tests in which your health care provider may ask many questions. Some of these questions may seem silly or strange,  but they are a very important test to help diagnose and treat confusion. TREATMENT  An admission to the hospital may not be needed, but a person with confusion should not be left alone. Stay with a family member or friend until the confusion clears. Avoid alcohol, pain relievers, or sedative drugs until you have fully recovered. Do not drive until directed by your health care provider. HOME CARE INSTRUCTIONS  What family and friends can do:  To find out if someone is confused, ask the person to state his or her name, age, and the date. If the person is unsure or answers incorrectly, he or she is confused.  Always introduce yourself, no matter how well the person knows you.  Often remind the person of his or her location.  Place a calendar and clock near the confused person.  Help the person with his or her medicines. You may want to use a pill box, an alarm as a reminder, or give the person each dose as prescribed.  Talk about current events and plans for the day.  Try to keep the environment calm, quiet, and peaceful.  Make sure the person keeps follow-up visits with his or her health care provider. PREVENTION  Ways to prevent confusion:  Avoid alcohol.  Eat a balanced diet.  Get enough sleep.  Take medicine only as directed by your health care provider.  Do not become isolated. Spend time with other people and make plans for your days.  Keep careful watch on your blood sugar levels if you are diabetic. SEEK IMMEDIATE MEDICAL CARE IF:   You develop severe headaches, repeated vomiting, seizures, blackouts, or   slurred speech.  There is increasing confusion, weakness, numbness, restlessness, or personality changes.  You develop a loss of balance, have marked dizziness, feel uncoordinated, or fall.  You have delusions, hallucinations, or develop severe anxiety.  Your family members think you need to be rechecked.   This information is not intended to replace advice given  to you by your health care provider. Make sure you discuss any questions you have with your health care provider.   Document Released: 05/22/2004 Document Revised: 05/05/2014 Document Reviewed: 05/20/2013 Elsevier Interactive Patient Education 2016 [employer]  

## 2016-01-13 NOTE — Progress Notes (Signed)
Pt. Slept throughout the night with no c/o pain, SOB or acute distress noted. Will continue to monitor pt .

## 2016-01-13 NOTE — Discharge Summary (Signed)
Aaron [name], is a 82 y.o. male  DOB 05-01-1929  MRN [id number].  Admission date:  01/11/2016  Admitting Physician  Milagros LollSrikar Sudini, MD  Discharge Date:  01/13/2016   Primary MD  Danella PentonMark F Miller, MD  Recommendations for primary care physician for things to follow:   Follow-up with primary doctor in 1 week   Admission Diagnosis  Abnormal finding on EKG [R94.31] Fall from standing, initial encounter [W19.XXXA] Cellulitis, unspecified cellulitis site, unspecified extremity site, unspecified laterality [L03.90]   Discharge Diagnosis  Abnormal finding on EKG [R94.31] Fall from standing, initial encounter [W19.XXXA] Cellulitis, unspecified cellulitis site, unspecified extremity site, unspecified laterality [L03.90]   Active Problems:   Fall      Past Medical History:  Diagnosis Date  . Coronary artery disease with history of myocardial infarction without history of CABG   . Dementia   . Diabetes mellitus without complication (HCC)     History reviewed. No pertinent surgical history.     History of present illness and  Hospital Course:     Kindly see H&P for history of present illness and admission details, please review complete Labs, Consult reports and Test reports for all details in brief  HPI  from the history and physical done on the day of admission  82 year old male patient with history of dementia admitted for fall. Patient found to have slipper and fell on the ground. No seizure activity. Has dementia but there are no history of falls at home. Patient admitted to medical service under observation because of elevated troponin of 0.03. And initially started on IV heparin drip. And admitted to telemetry. Patient never had chest pain.  Hospital Course  #1 slightly elevated troponins without chest pain or EKG  changes. Monitored on telemetry, no further arrhythmias. Seen by cardiology. Patient had echocardiogram, EF for more than 55% with no wall motion abnormality. Discontinue the heparin. No further cardiac workup is suggested by cardiology because patient had no chest pain or EKG changes and no further elevation of troponins. #2 left axilla infection, left leg infection: Patient received doxycycline, he went to primary care before he came the hospital and was given doxycycline. He did not have any abscess of the axilla. WBC normal. No fever. Advised to continue doxycycline and finished the course. Advised to use warm compressions. #3 diabetes mellitus type 2 without complications: Continue home medications, patient wanted glucometer and supplies. Which I  Ordered  Thru DME 4. hyperlipidemia #5 of Alzheimer's dementia   Discharge Condition: stable   Follow UP  Follow-up Information    Danella PentonMark F Miller, MD Follow up in 1 week(s).   Specialty:  Internal Medicine Contact information: [phone] [facility] [facility] [place] [id number] [phone]             Discharge Instructions  and  Discharge Medications        Medication List    TAKE these medications   aspirin EC 81 MG tablet Take 162 mg by mouth daily.   atorvastatin 20 MG tablet Commonly known as:  LIPITOR Take 20 mg by mouth at bedtime.   citalopram 20 MG tablet Commonly known as:  CELEXA Take 20 mg by mouth daily.   docusate sodium 100 MG capsule Commonly known as:  COLACE Take 100 mg by mouth daily.   doxazosin 2 MG tablet Commonly known as:  CARDURA Take 2 mg by mouth at bedtime.   doxycycline 100 MG capsule Commonly known as:  VIBRAMYCIN Take  100 mg by mouth 2 (two) times daily. For 7 days   finasteride 5 MG tablet Commonly known as:  PROSCAR Take 5 mg by mouth daily.   furosemide 20 MG tablet Commonly known as:  LASIX Take 20 mg by mouth daily.   galantamine 12 MG  tablet Commonly known as:  RAZADYNE Take 12 mg by mouth 2 (two) times daily with a meal.   glipiZIDE 10 MG tablet Commonly known as:  GLUCOTROL Take 10 mg by mouth 2 (two) times daily before a meal.   Melatonin 5 MG Tabs Take 5 mg by mouth at bedtime.   memantine 10 MG tablet Commonly known as:  NAMENDA Take 10 mg by mouth 2 (two) times daily.   metFORMIN 500 MG 24 hr tablet Commonly known as:  GLUCOPHAGE-XR Take 500-1,000 mg by mouth 2 (two) times daily. 500 mg every morning and 1000 mg at bedtime   multivitamin Tabs tablet Take 1 tablet by mouth daily.   sitaGLIPtin 100 MG tablet Commonly known as:  JANUVIA Take 100 mg by mouth daily.   spironolactone 25 MG tablet Commonly known as:  ALDACTONE Take 12.5 mg by mouth daily.         Diet and Activity recommendation: See Discharge Instructions above   Consults obtained -cardiology   Major procedures and Radiology Reports - PLEASE review detailed and final reports for all details, in brief -     Dg Chest 2 View  Result Date: 01/11/2016 CLINICAL DATA:  Shortness of breath with CHF and pneumonia. EXAM: CHEST  2 VIEW COMPARISON:  None. FINDINGS: The lungs are clear wiithout focal pneumonia, edema, pneumothorax or pleural effusion. Interstitial markings are diffusely coarsened with chronic features. Cardiopericardial silhouette is at upper limits of normal for size. Patient is status post CABG. The visualized bony structures of the thorax are intact. Telemetry leads overlie the chest. IMPRESSION: No active cardiopulmonary disease. Electronically Signed   By: Kennith Center M.D.   On: 01/11/2016 11:47   Ct Head Wo Contrast  Result Date: 01/11/2016 CLINICAL DATA:  Multiple falls.  Altered mental status. EXAM: CT HEAD WITHOUT CONTRAST TECHNIQUE: Contiguous axial images were obtained from the base of the skull through the vertex without intravenous contrast. COMPARISON:  None. FINDINGS: Brain: No evidence of acute infarction,  hemorrhage, extra-axial collection, ventriculomegaly, or mass effect. Generalized cerebral atrophy. Periventricular white matter low attenuation likely secondary to microangiopathy. Vascular: Cerebrovascular atherosclerotic calcifications are noted. Skull: Negative for fracture or focal lesion. Sinuses/Orbits: Visualized portions of the orbits are unremarkable. Visualized portions of the paranasal sinuses and mastoid air cells are unremarkable. Other: None. IMPRESSION: No acute intracranial pathology. Electronically Signed   By: Elige Ko   On: 01/11/2016 11:47    Micro Results     No results found for this or any previous visit (from the past 240 hour(s)).     Today   Subjective:   Aaron [name] today has no headache,no chest abdominal pain,no new weakness tingling or numbness, feels much better wants to go home today.   Objective:   Blood pressure (!) 155/77, pulse 80, temperature 97.5 F (36.4 C), temperature source Oral, resp. rate 18, height 5\' 10"  (1.778 m), weight 93.7 kg (206 lb 9.6 oz), SpO2 96 %.   Intake/Output Summary (Last 24 hours) at 01/13/16 1056 Last data filed at 01/13/16 0900  Gross per 24 hour  Intake              480 ml  Output  750 ml  Net             -270 ml    Exam Awake Alert, Oriented x 3, No new F.N deficits, Normal affect [place].AT,PERRAL Supple Neck,No JVD, No cervical lymphadenopathy appriciated.  Symmetrical Chest wall movement, Good air movement bilaterally, CTAB RRR,No Gallops,Rubs or new Murmurs, No Parasternal Heave +ve B.Sounds, Abd Soft, Non tender, No organomegaly appriciated, No rebound -guarding or rigidity. No Cyanosis, Clubbing or edema, No new Rash or bruise  Data Review   CBC w Diff: Lab Results  Component Value Date   WBC 10.6 01/12/2016   HGB 14.3 01/12/2016   HGB 12.9 (L) 05/30/2013   HCT 41.6 01/12/2016   HCT 38.8 (L) 05/30/2013   PLT 174 01/12/2016   PLT 287 05/30/2013   LYMPHOPCT 17.8 05/27/2013   MONOPCT  11.5 05/27/2013   EOSPCT 1.5 05/27/2013   BASOPCT 0.3 05/27/2013    CMP: Lab Results  Component Value Date   NA 137 01/12/2016   NA 130 (L) 05/30/2013   K 3.9 01/12/2016   K 5.0 05/30/2013   CL 101 01/12/2016   CL 97 (L) 05/30/2013   CO2 26 01/12/2016   CO2 28 05/30/2013   BUN 18 01/12/2016   BUN 40 (H) 05/30/2013   CREATININE 0.92 01/12/2016   CREATININE 1.58 (H) 05/30/2013   PROT 7.5 05/30/2013   ALBUMIN 3.5 05/30/2013   BILITOT 0.4 05/30/2013   ALKPHOS 55 05/30/2013   AST 23 05/30/2013   ALT 34 05/30/2013  .   Total Time in preparing paper work, data evaluation and todays exam - 35 minutes  Aaron [name] M.D on 01/13/2016 at 10:56 AM    Note: This dictation was prepared with Dragon dictation along with smaller phrase technology. Any transcriptional errors that result from this process are unintentional.

## 2016-01-13 NOTE — Care Management Note (Signed)
Case Management Note  Patient Details  Name: Aaron [name] MRN: [id number] Date of Birth: 1930-03-01  Subjective/Objective:   No home health services ordered. Discussed glucometer order with Aaron [name]. [name] is insured and does not qualify for a charity glucometer. [name] can use his insurance and obtain a glucometer from any pharmacy.                 Action/Plan:   Expected Discharge Date:                  Expected Discharge Plan:     In-House Referral:     Discharge planning Services     Post Acute Care Choice:    Choice offered to:     DME Arranged:    DME Agency:     HH Arranged:    HH Agency:     Status of Service:     If discussed at [employer] Length of Stay Meetings, dates discussed:    Additional Comments:  Lucy Boardman A, [name] 01/13/2016, 9:50 AM

## 2017-02-18 ENCOUNTER — Inpatient Hospital Stay
Admit: 2017-02-18 | Discharge: 2017-02-18 | Disposition: A | Discharge status: Home / Self Care | Payer: Medicare Other | Attending: Internal Medicine | Admitting: Internal Medicine

## 2017-02-18 ENCOUNTER — Encounter: Payer: Self-pay | Admitting: Emergency Medicine

## 2017-02-18 ENCOUNTER — Emergency Department: Payer: Medicare Other

## 2017-02-18 ENCOUNTER — Inpatient Hospital Stay
Admission: EM | Admit: 2017-02-18 | Discharge: 2017-02-20 | DRG: 291 | Disposition: A | Discharge status: Home Health | Payer: Medicare Other | Attending: Internal Medicine | Admitting: Internal Medicine

## 2017-02-18 DIAGNOSIS — I5031 Acute diastolic (congestive) heart failure: Secondary | ICD-10-CM

## 2017-02-18 DIAGNOSIS — I480 Paroxysmal atrial fibrillation: Secondary | ICD-10-CM | POA: Diagnosis present

## 2017-02-18 DIAGNOSIS — I252 Old myocardial infarction: Secondary | ICD-10-CM | POA: Diagnosis not present

## 2017-02-18 DIAGNOSIS — Z8249 Family history of ischemic heart disease and other diseases of the circulatory system: Secondary | ICD-10-CM | POA: Diagnosis not present

## 2017-02-18 DIAGNOSIS — I110 Hypertensive heart disease with heart failure: Principal | ICD-10-CM | POA: Diagnosis present

## 2017-02-18 DIAGNOSIS — I5041 Acute combined systolic (congestive) and diastolic (congestive) heart failure: Secondary | ICD-10-CM | POA: Diagnosis present

## 2017-02-18 DIAGNOSIS — Z66 Do not resuscitate: Secondary | ICD-10-CM | POA: Diagnosis present

## 2017-02-18 DIAGNOSIS — Z951 Presence of aortocoronary bypass graft: Secondary | ICD-10-CM | POA: Diagnosis not present

## 2017-02-18 DIAGNOSIS — J9601 Acute respiratory failure with hypoxia: Secondary | ICD-10-CM | POA: Diagnosis present

## 2017-02-18 DIAGNOSIS — I4891 Unspecified atrial fibrillation: Secondary | ICD-10-CM

## 2017-02-18 DIAGNOSIS — I509 Heart failure, unspecified: Secondary | ICD-10-CM

## 2017-02-18 DIAGNOSIS — Z7982 Long term (current) use of aspirin: Secondary | ICD-10-CM | POA: Diagnosis not present

## 2017-02-18 DIAGNOSIS — Z882 Allergy status to sulfonamides status: Secondary | ICD-10-CM

## 2017-02-18 DIAGNOSIS — E785 Hyperlipidemia, unspecified: Secondary | ICD-10-CM | POA: Diagnosis present

## 2017-02-18 DIAGNOSIS — I2510 Atherosclerotic heart disease of native coronary artery without angina pectoris: Secondary | ICD-10-CM | POA: Diagnosis present

## 2017-02-18 DIAGNOSIS — F0390 Unspecified dementia without behavioral disturbance: Secondary | ICD-10-CM | POA: Diagnosis present

## 2017-02-18 DIAGNOSIS — E119 Type 2 diabetes mellitus without complications: Secondary | ICD-10-CM | POA: Diagnosis present

## 2017-02-18 DIAGNOSIS — Z7984 Long term (current) use of oral hypoglycemic drugs: Secondary | ICD-10-CM

## 2017-02-18 HISTORY — DX: Heart failure, unspecified: I50.9

## 2017-02-18 LAB — BASIC METABOLIC PANEL
ANION GAP: 11 (ref 5–15)
BUN: 19 mg/dL (ref 6–20)
CHLORIDE: 97 mmol/L — AB (ref 101–111)
CO2: 30 mmol/L (ref 22–32)
Calcium: 9.9 mg/dL (ref 8.9–10.3)
Creatinine, Ser: 1.16 mg/dL (ref 0.61–1.24)
GFR calc Af Amer: >60 mL/min (ref >60)
GFR calc non Af Amer: 56 mL/min — ABNORMAL LOW (ref >60)
GLUCOSE: 241 mg/dL — AB (ref 65–99)
POTASSIUM: 4 mmol/L (ref 3.5–5.1)
SODIUM: 138 mmol/L (ref 135–145)

## 2017-02-18 LAB — CBC
HCT: 45.2 % (ref 40.0–52.0)
Hemoglobin: 14.5 g/dL (ref 13.0–18.0)
MCH: 31.3 pg (ref 26.0–34.0)
MCHC: 32.2 g/dL (ref 32.0–36.0)
MCV: 97.1 fL (ref 80.0–100.0)
Platelets: 178 K/uL (ref 150–440)
RBC: 4.65 MIL/uL (ref 4.40–5.90)
RDW: 15.2 % — ABNORMAL HIGH (ref 11.5–14.5)
WBC: 8.2 K/uL (ref 3.8–10.6)

## 2017-02-18 LAB — TROPONIN I
TROPONIN I: 0.06 ng/mL — AB (ref <0.03)
TROPONIN I: 0.06 ng/mL — AB (ref <0.03)
Troponin I: 0.05 ng/mL (ref <0.03)

## 2017-02-18 LAB — BRAIN NATRIURETIC PEPTIDE: B Natriuretic Peptide: 560 pg/mL — ABNORMAL HIGH (ref 0.0–100.0)

## 2017-02-18 MED ORDER — ADULT MULTIVITAMIN W/MINERALS CH
1.0000 | ORAL_TABLET | Freq: Every day | ORAL | Status: DC
  Administered 2017-02-19 – 2017-02-20 (×2): 1 via ORAL
  Filled 2017-02-18 (×2): qty 1

## 2017-02-18 MED ORDER — DOCUSATE SODIUM 100 MG PO CAPS
100.0000 mg | ORAL_CAPSULE | Freq: Every day | ORAL | Status: DC
  Administered 2017-02-19 – 2017-02-20 (×2): 100 mg via ORAL
  Filled 2017-02-18 (×2): qty 1

## 2017-02-18 MED ORDER — LINAGLIPTIN 5 MG PO TABS
5.0000 mg | ORAL_TABLET | Freq: Every day | ORAL | Status: DC
  Administered 2017-02-19 – 2017-02-20 (×2): 5 mg via ORAL
  Filled 2017-02-18 (×2): qty 1

## 2017-02-18 MED ORDER — INSULIN ASPART 100 UNIT/ML ~~LOC~~ SOLN
0.0000 [IU] | Freq: Three times a day (TID) | SUBCUTANEOUS | Status: DC
  Administered 2017-02-19 (×2): 1 [IU] via SUBCUTANEOUS
  Filled 2017-02-18 (×3): qty 1

## 2017-02-18 MED ORDER — ORAL CARE MOUTH RINSE
15.0000 mL | Freq: Two times a day (BID) | OROMUCOSAL | Status: DC
  Administered 2017-02-19 – 2017-02-20 (×3): 15 mL via OROMUCOSAL

## 2017-02-18 MED ORDER — HEPARIN SODIUM (PORCINE) 5000 UNIT/ML IJ SOLN
5000.0000 [IU] | Freq: Three times a day (TID) | INTRAMUSCULAR | Status: DC
  Administered 2017-02-18 – 2017-02-19 (×3): 5000 [IU] via SUBCUTANEOUS
  Filled 2017-02-18 (×3): qty 1

## 2017-02-18 MED ORDER — METFORMIN HCL ER 500 MG PO TB24
500.0000 mg | ORAL_TABLET | Freq: Every day | ORAL | Status: DC
  Administered 2017-02-19: 500 mg via ORAL
  Filled 2017-02-18 (×2): qty 1

## 2017-02-18 MED ORDER — FINASTERIDE 5 MG PO TABS
5.0000 mg | ORAL_TABLET | Freq: Every day | ORAL | Status: DC
  Administered 2017-02-19 – 2017-02-20 (×2): 5 mg via ORAL
  Filled 2017-02-18 (×2): qty 1

## 2017-02-18 MED ORDER — ASPIRIN EC 81 MG PO TBEC
162.0000 mg | DELAYED_RELEASE_TABLET | Freq: Every day | ORAL | Status: DC
Start: 2017-02-19 — End: 2017-02-20
  Administered 2017-02-19 – 2017-02-20 (×2): 162 mg via ORAL
  Filled 2017-02-18 (×2): qty 2

## 2017-02-18 MED ORDER — MELATONIN 5 MG PO TABS
5.0000 mg | ORAL_TABLET | Freq: Every day | ORAL | Status: DC
  Administered 2017-02-18 – 2017-02-19 (×2): 5 mg via ORAL
  Filled 2017-02-18 (×3): qty 1

## 2017-02-18 MED ORDER — DOCUSATE SODIUM 100 MG PO CAPS: 100.0000 mg | ORAL_CAPSULE | Freq: Two times a day (BID) | ORAL | Status: DC | PRN

## 2017-02-18 MED ORDER — FUROSEMIDE 10 MG/ML IJ SOLN
20.0000 mg | Freq: Three times a day (TID) | INTRAMUSCULAR | Status: DC
  Administered 2017-02-18 – 2017-02-20 (×5): 20 mg via INTRAVENOUS
  Filled 2017-02-18 (×5): qty 2

## 2017-02-18 MED ORDER — CITALOPRAM HYDROBROMIDE 20 MG PO TABS
20.0000 mg | ORAL_TABLET | Freq: Every day | ORAL | Status: DC
  Administered 2017-02-19 – 2017-02-20 (×2): 20 mg via ORAL
  Filled 2017-02-18 (×2): qty 1

## 2017-02-18 MED ORDER — SPIRONOLACTONE 25 MG PO TABS
12.5000 mg | ORAL_TABLET | Freq: Every day | ORAL | Status: DC
  Administered 2017-02-19 – 2017-02-20 (×2): 12.5 mg via ORAL
  Filled 2017-02-18 (×2): qty 1

## 2017-02-18 MED ORDER — FUROSEMIDE 10 MG/ML IJ SOLN
40.0000 mg | Freq: Once | INTRAMUSCULAR | Status: AC
  Administered 2017-02-18: 40 mg via INTRAVENOUS
  Filled 2017-02-18: qty 4

## 2017-02-18 MED ORDER — ATORVASTATIN CALCIUM 20 MG PO TABS
20.0000 mg | ORAL_TABLET | Freq: Every day | ORAL | Status: DC
  Administered 2017-02-18 – 2017-02-19 (×2): 20 mg via ORAL
  Filled 2017-02-18 (×2): qty 1

## 2017-02-18 MED ORDER — GLIPIZIDE 10 MG PO TABS
10.0000 mg | ORAL_TABLET | Freq: Two times a day (BID) | ORAL | Status: DC
  Administered 2017-02-19 (×2): 10 mg via ORAL
  Filled 2017-02-18 (×3): qty 1

## 2017-02-18 MED ORDER — GALANTAMINE HYDROBROMIDE 4 MG PO TABS
12.0000 mg | ORAL_TABLET | Freq: Two times a day (BID) | ORAL | Status: DC
  Administered 2017-02-19 – 2017-02-20 (×3): 12 mg via ORAL
  Filled 2017-02-18 (×4): qty 3

## 2017-02-18 MED ORDER — ALBUTEROL SULFATE (2.5 MG/3ML) 0.083% IN NEBU
5.0000 mg | INHALATION_SOLUTION | Freq: Once | RESPIRATORY_TRACT | Status: AC
  Administered 2017-02-18: 5 mg via RESPIRATORY_TRACT
  Filled 2017-02-18: qty 6

## 2017-02-18 MED ORDER — METFORMIN HCL ER 500 MG PO TB24
1000.0000 mg | ORAL_TABLET | Freq: Every day | ORAL | Status: DC
  Administered 2017-02-18 – 2017-02-19 (×2): 1000 mg via ORAL
  Filled 2017-02-18 (×3): qty 2

## 2017-02-18 MED ORDER — DOXAZOSIN MESYLATE 2 MG PO TABS
2.0000 mg | ORAL_TABLET | Freq: Every day | ORAL | Status: DC
  Administered 2017-02-18 – 2017-02-19 (×2): 2 mg via ORAL
  Filled 2017-02-18 (×3): qty 1

## 2017-02-18 MED ORDER — MEMANTINE HCL 5 MG PO TABS
10.0000 mg | ORAL_TABLET | Freq: Two times a day (BID) | ORAL | Status: DC
  Administered 2017-02-18 – 2017-02-20 (×4): 10 mg via ORAL
  Filled 2017-02-18 (×5): qty 1

## 2017-02-18 NOTE — ED Notes (Signed)
Family concerned about pt's "shortness of breath". MD veronese at bedside to discuss and assess. "Ok " to continue to floor

## 2017-02-18 NOTE — H&P (Signed)
[facility] [place] [facility]   PATIENT NAME: Aaron SaugerRalph [name]    MR#:  [id number]  DATE OF BIRTH:  [birth year]  DATE OF ADMISSION:  02/18/2017  PRIMARY CARE PHYSICIAN: Danella PentonMiller, Mark F, MD   REQUESTING/REFERRING PHYSICIAN: Don PerkingVeronese  CHIEF COMPLAINT:   Chief Complaint  Patient presents with  . Shortness of Breath    HISTORY OF PRESENT ILLNESS: Aaron SaugerRalph [name]  is a 83 y.o. male with a known history of CHF, CAD- bypass, Dementia, DM- Had SOB and edema or last 1-2 days, went to Dr. Bethann PunchesMark Miller today, where Noted to have hypoxia and CHF, so sent to ER. Pt have dementia, Does not give any symptoms or complains. I spoke to his son on phone. Admission for Hypoxia with respi failure.  PAST MEDICAL HISTORY:   Past Medical History:  Diagnosis Date  . CHF (congestive heart failure) (HCC)   . Coronary artery disease with history of myocardial infarction without history of CABG   . Dementia   . Diabetes mellitus without complication (HCC)     PAST SURGICAL HISTORY: Past Surgical History:  Procedure Laterality Date  . CORONARY ARTERY BYPASS GRAFT      SOCIAL HISTORY:  Social History  Substance Use Topics  . Smoking status: Never [name]  . Smokeless tobacco: Never Used  . Alcohol use No    FAMILY HISTORY:  Family History  Problem Relation Age of Onset  . Hypertension Mother     DRUG ALLERGIES:  Allergies  Allergen Reactions  . Sulfa Antibiotics Itching    REVIEW OF SYSTEMS:   CONSTITUTIONAL: No fever, fatigue or weakness.  EYES: No blurred or double vision.  EARS, NOSE, AND THROAT: No tinnitus or ear pain.  RESPIRATORY: No cough,have  shortness of breath, no wheezing or hemoptysis.  CARDIOVASCULAR: No chest pain, have orthopnea, edema.  GASTROINTESTINAL: No nausea, vomiting, diarrhea or abdominal pain.  GENITOURINARY: No dysuria, hematuria.  ENDOCRINE: No polyuria, nocturia,  HEMATOLOGY: No anemia, easy bruising or bleeding SKIN: No rash or  lesion. MUSCULOSKELETAL: No joint pain or arthritis.   NEUROLOGIC: No tingling, numbness, weakness.  PSYCHIATRY: No anxiety or depression.   MEDICATIONS AT HOME:  Prior to Admission medications   Medication Sig Start Date End Date Taking? Authorizing Provider  aspirin EC 81 MG tablet Take 162 mg by mouth daily.   Yes [provider]  atorvastatin (LIPITOR) 20 MG tablet Take 20 mg by mouth at bedtime.   Yes [provider]  citalopram (CELEXA) 20 MG tablet Take 20 mg by mouth daily.   Yes [provider]  docusate sodium (COLACE) 100 MG capsule Take 100 mg by mouth daily.   Yes [provider]  doxazosin (CARDURA) 2 MG tablet Take 2 mg by mouth at bedtime.   Yes [provider]  finasteride (PROSCAR) 5 MG tablet Take 5 mg by mouth daily.   Yes [provider]  furosemide (LASIX) 20 MG tablet Take 20 mg by mouth daily.   Yes [provider]  galantamine (RAZADYNE) 12 MG tablet Take 12 mg by mouth 2 (two) times daily with a meal.   Yes [provider]  glipiZIDE (GLUCOTROL) 10 MG tablet Take 10 mg by mouth 2 (two) times daily before a meal.   Yes [provider]  Melatonin 5 MG TABS Take 5 mg by mouth at bedtime.   Yes [provider]  memantine (NAMENDA) 10 MG tablet Take 10 mg by mouth 2 (two) times daily.  Yes [provider]  metFORMIN (GLUCOPHAGE-XR) 500 MG 24 hr tablet Take 500-1,000 mg by mouth 2 (two) times daily. 500 mg every morning and 1000 mg at bedtime   Yes [provider]  multivitamin (ONE-A-DAY MEN'S) TABS tablet Take 1 tablet by mouth daily.   Yes [provider]  sitaGLIPtin (JANUVIA) 100 MG tablet Take 100 mg by mouth daily.   Yes [provider]  spironolactone (ALDACTONE) 25 MG tablet Take 12.5 mg by mouth daily.   Yes [provider]      PHYSICAL EXAMINATION:   VITAL SIGNS: Blood pressure (!) 144/79, pulse 79, temperature 98.6 F (37  C), temperature source Oral, height 5\' 10"  (1.778 m), weight 98.9 kg (218 lb), SpO2 93 %.  GENERAL:  83 y.o.-year-old patient lying in the bed with no acute distress.  EYES: Pupils equal, round, reactive to light and accommodation. No scleral icterus. Extraocular muscles intact.  HEENT: Head atraumatic, normocephalic. Oropharynx and nasopharynx clear.  NECK:  Supple, no jugular venous distention. No thyroid enlargement, no tenderness.  LUNGS: Normal breath sounds bilaterally, no wheezing, have crepitation. No use of accessory muscles of respiration.  CARDIOVASCULAR: S1, S2 normal. No murmurs, rubs, or gallops.  ABDOMEN: Soft, nontender, nondistended. Bowel sounds present. No organomegaly or mass.  EXTREMITIES: b/l pedal edema,no cyanosis, or clubbing.  NEUROLOGIC: Cranial nerves II through XII are intact. Muscle strength 5/5 in all extremities. Sensation intact. Gait not checked.  PSYCHIATRIC: The patient is alert and oriented x 3.  SKIN: No obvious rash, lesion, or ulcer.   LABORATORY PANEL:   CBC  Recent Labs Lab 02/18/17 1228  WBC 8.2  HGB 14.5  HCT 45.2  PLT 178  MCV 97.1  MCH 31.3  MCHC 32.2  RDW 15.2*   ------------------------------------------------------------------------------------------------------------------  Chemistries   Recent Labs Lab 02/18/17 1228  NA 138  K 4.0  CL 97*  CO2 30  GLUCOSE 241*  BUN 19  CREATININE 1.16  CALCIUM 9.9   ------------------------------------------------------------------------------------------------------------------ estimated creatinine clearance is 54.9 mL/min (by C-G formula based on SCr of 1.16 mg/dL). ------------------------------------------------------------------------------------------------------------------ No results for input(s): TSH, T4TOTAL, T3FREE, THYROIDAB in the last 72 hours.  Invalid input(s): FREET3   Coagulation profile No results for input(s): INR, PROTIME in the last 168  hours. ------------------------------------------------------------------------------------------------------------------- No results for input(s): DDIMER in the last 72 hours. -------------------------------------------------------------------------------------------------------------------  Cardiac Enzymes  Recent Labs Lab 02/18/17 1228  TROPONINI 0.05*   ------------------------------------------------------------------------------------------------------------------ Invalid input(s): POCBNP  ---------------------------------------------------------------------------------------------------------------  Urinalysis    Component Value Date/Time   COLORURINE YELLOW (A) 01/11/2016 1038   APPEARANCEUR CLEAR (A) 01/11/2016 1038   APPEARANCEUR Clear 05/27/2013 1628   LABSPEC 1.010 01/11/2016 1038   LABSPEC 1.016 05/27/2013 1628   PHURINE 6.0 01/11/2016 1038   GLUCOSEU >500 (A) 01/11/2016 1038   GLUCOSEU Negative 05/27/2013 1628   HGBUR NEGATIVE 01/11/2016 1038   BILIRUBINUR NEGATIVE 01/11/2016 1038   BILIRUBINUR Negative 05/27/2013 1628   KETONESUR NEGATIVE 01/11/2016 1038   PROTEINUR NEGATIVE 01/11/2016 1038   NITRITE NEGATIVE 01/11/2016 1038   LEUKOCYTESUR NEGATIVE 01/11/2016 1038   LEUKOCYTESUR Negative 05/27/2013 1628     RADIOLOGY: Dg Chest Portable 1 View  Result Date: 02/18/2017 CLINICAL DATA:  Shortness of breath since 02/13/2017. Cough beginning last night. EXAM: PORTABLE CHEST 1 VIEW COMPARISON:  PA and lateral chest 01/11/2016. FINDINGS: There is cardiomegaly and pulmonary edema. Small left pleural effusion and basilar atelectasis are noted. The patient is status post CABG. Aortic atherosclerosis is seen. IMPRESSION: Congestive heart failure with associated  small left pleural effusion. Atherosclerosis. Electronically Signed   By: Drusilla Kanner M.D.   On: 02/18/2017 13:33    EKG: Orders placed or performed during the hospital encounter of 02/18/17  . ED EKG   . ED EKG    IMPRESSION AND PLAN:  * Ac hypoxic respi failrue   Ac diastolic CHF    IV lasix   I/o monitor, Fluid restriction   Cardio consult and echo/    Monitor tele and Trop X 3.  * DM   Cont home meds, Keep on ISS.  * Htn   Cont home meds  * Hyperlipidemia   Cont atorvastatin.  * dementia   At baseline.   All the records are reviewed and case discussed with ED provider. Management plans discussed with the patient, family and they are in agreement.  CODE STATUS: DNR Code Status History    Date Active Date Inactive Code Status Order ID Comments User Context   01/11/2016  5:20 PM 01/12/2016  6:37 AM DNR [id number]  Milagros Loll, MD Inpatient   01/11/2016  1:07 PM 01/11/2016  5:20 PM Full Code [id number]  Milagros Loll, MD ED    Questions for Most Recent Historical Code Status (Order [id number])    Question Answer Comment   In the event of cardiac or respiratory ARREST Do not call a "code blue"    In the event of cardiac or respiratory ARREST Do not perform Intubation, CPR, defibrillation or ACLS    In the event of cardiac or respiratory ARREST Use medication by any route, position, wound care, and other measures to relive pain and suffering. May use oxygen, suction and manual treatment of airway obstruction as needed for comfort.         Advance Directive Documentation     Most Recent Value  Type of Advance Directive  Out of facility DNR (pink MOST or yellow form)  Pre-existing out of facility DNR order (yellow form or pink MOST form)  -  "MOST" Form in Place?  -     I spoke to pt's son on phone.  TOTAL TIME TAKING CARE OF THIS PATIENT: 50 minutes.    Altamese Dilling M.D on 02/18/2017   Between 7am to 6pm - Pager - [phone]  After 6pm go to [email or web address] - password [email or web address]  [facility]  Office  [phone]  CC: Primary care physician; Danella Penton, MD   Note: This dictation was prepared with Dragon dictation along with  smaller phrase technology. Any transcriptional errors that result from this process are unintentional.

## 2017-02-18 NOTE — ED Provider Notes (Signed)
[facility] Emergency Department Provider Note  ____________________________________________  Time seen: Approximately 1:40 PM  I have reviewed the triage vital signs and the nursing notes.   HISTORY  Chief Complaint Shortness of Breath   HPI Aaron [name] is a 83 y.o. male with a history of paroxysmal atrial fibrillation, CHF (EF of 55-65% on September 2017), dementia, CAD status post CABG, diabetes who presents for evaluation of shortness of breath.patient has had a nonproductive cough for the last 3 days and progressively worsening shortness of breath which became severe today. Patient has audible wheezing starting this morning. patient went to his primary care doctor where he was found to be satting 77% on room air. No prior history of oxygen requirement. No fever or chills, no history of COPD or asthma, no prior history of smoking. Patient denies chest pain. Son has noted increased swelling on bilateral lower extremity and for the last 3 days has increased his Lasix from 20-40 daily. Patient has had a 4 pound weight gain over the last 24 hours.  Past Medical History:  Diagnosis Date  . CHF (congestive heart failure) (HCC)   . Coronary artery disease with history of myocardial infarction without history of CABG   . Dementia   . Diabetes mellitus without complication [facility])     Patient Active Problem List   Diagnosis Date Noted  . Fall 01/11/2016    Past Surgical History:  Procedure Laterality Date  . CORONARY ARTERY BYPASS GRAFT      Prior to Admission medications   Medication Sig Start Date End Date Taking? Authorizing Provider  aspirin EC 81 MG tablet Take 162 mg by mouth daily.   Yes [provider]  atorvastatin (LIPITOR) 20 MG tablet Take 20 mg by mouth at bedtime.   Yes [provider]  citalopram (CELEXA) 20 MG tablet Take 20 mg by mouth daily.   Yes [provider]  docusate sodium (COLACE) 100 MG capsule Take  100 mg by mouth daily.   Yes [provider]  doxazosin (CARDURA) 2 MG tablet Take 2 mg by mouth at bedtime.   Yes [provider]  finasteride (PROSCAR) 5 MG tablet Take 5 mg by mouth daily.   Yes [provider]  furosemide (LASIX) 20 MG tablet Take 20 mg by mouth daily.   Yes [provider]  galantamine (RAZADYNE) 12 MG tablet Take 12 mg by mouth 2 (two) times daily with a meal.   Yes [provider]  glipiZIDE (GLUCOTROL) 10 MG tablet Take 10 mg by mouth 2 (two) times daily before a meal.   Yes [provider]  Melatonin 5 MG TABS Take 5 mg by mouth at bedtime.   Yes [provider]  memantine (NAMENDA) 10 MG tablet Take 10 mg by mouth 2 (two) times daily.   Yes [provider]  metFORMIN (GLUCOPHAGE-XR) 500 MG 24 hr tablet Take 500-1,000 mg by mouth 2 (two) times daily. 500 mg every morning and 1000 mg at bedtime   Yes [provider]  multivitamin (ONE-A-DAY MEN'S) TABS tablet Take 1 tablet by mouth daily.   Yes [provider]  sitaGLIPtin (JANUVIA) 100 MG tablet Take 100 mg by mouth daily.   Yes [provider]  spironolactone (ALDACTONE) 25 MG tablet Take 12.5 mg by mouth daily.   Yes [provider]    Allergies Sulfa antibiotics  No family history on file.  Social History Social History  Substance Use Topics  .  Smoking status: Never Smoker  . Smokeless tobacco: Never Used  . Alcohol use No    Review of Systems  Constitutional: Negative for fever. +weight gain Eyes: Negative for visual changes. ENT: Negative for sore throat. Neck: No neck pain  Cardiovascular: Negative for chest pain. Respiratory: + shortness of breath, cough Gastrointestinal: Negative for abdominal pain, vomiting or diarrhea. Genitourinary: Negative for dysuria. Musculoskeletal: Negative for back pain. + bilateral leg swelling Skin: Negative for rash. Neurological: Negative for headaches,  weakness or numbness. Psych: No SI or HI  ____________________________________________   PHYSICAL EXAM:  VITAL SIGNS: ED Triage Vitals  Enc Vitals Group     BP 02/18/17 1158 (!) 144/79     Pulse Rate 02/18/17 1158 79     Resp --      Temp 02/18/17 1158 98.6 F (37 C)     Temp Source 02/18/17 1158 Oral     SpO2 02/18/17 1158 93 %     Weight 02/18/17 1209 218 lb (98.9 kg)     Height 02/18/17 1209 5\' 10"  (1.778 m)     Head Circumference --      Peak Flow --      Pain Score --      Pain Loc --      Pain Edu? --      Excl. in GC? --     Constitutional: Alert and oriented, audible wheezing and increased WOB.  HEENT:      Head: Normocephalic and atraumatic.         Eyes: Conjunctivae are normal. Sclera is non-icteric.       Mouth/Throat: Mucous membranes are moist.       Neck: Supple with no signs of meningismus. Cardiovascular: irregularly irregular rhythm with normal rate. No murmurs, gallops, or rubs. 2+ symmetrical distal pulses are present in all extremities. JVD is elevated to the earlobe bilaterally Respiratory: increased work of breathing, hypoxic on room air, satting 91-92% on 4 L nasal cannula, diffuse rhonchi and crackles bilaterally Gastrointestinal: Soft, non tender, and non distended with positive bowel sounds. No rebound or guarding. Musculoskeletal: 2+ pitting edema bilaterally Neurologic: Normal speech and language. Face is symmetric. Moving all extremities. No gross focal neurologic deficits are appreciated. Skin: Skin is warm, dry and intact. No rash noted. Psychiatric: Mood and affect are normal. Speech and behavior are normal.  ____________________________________________   LABS (all labs ordered are listed, but only abnormal results are displayed)  Labs Reviewed  BASIC METABOLIC PANEL - Abnormal; Notable for the following:       Result Value   Chloride 97 (*)    Glucose, Bld 241 (*)    GFR calc non Af Amer 56 (*)    All other components within  normal limits  CBC - Abnormal; Notable for the following:    RDW 15.2 (*)    All other components within normal limits  TROPONIN I - Abnormal; Notable for the following:    Troponin I 0.05 (*)    All other components within normal limits  BRAIN NATRIURETIC PEPTIDE - Abnormal; Notable for the following:    B Natriuretic Peptide 560.0 (*)    All other components within normal limits   ____________________________________________  EKG  ED ECG REPORT I, Nita Sickle, the attending physician, personally viewed and interpreted this ECG.  Atrial fibrillation, rate of 75, normal QRS and QTc intervals, normal axis, ST depressions on inferior lateral leads, no ST elevation. unchanged from prior from September 2017 ____________________________________________  RADIOLOGY  CXR:  Congestive heart failure with associated small left pleural effusion. ____________________________________________   PROCEDURES  Procedure(s) performed: None Procedures Critical Care performed: yes  CRITICAL CARE Performed by: Nita Sicklearolina Shloima Clinch  ?  Total critical care time: 35 min  Critical care time was exclusive of separately billable procedures and treating other patients.  Critical care was necessary to treat or prevent imminent or life-threatening deterioration.  Critical care was time spent personally by me on the following activities: development of treatment plan with patient and/or surrogate as well as nursing, discussions with consultants, evaluation of patient's response to treatment, examination of patient, obtaining history from patient or surrogate, ordering and performing treatments and interventions, ordering and review of laboratory studies, ordering and review of radiographic studies, pulse oximetry and re-evaluation of patient's condition.   ____________________________________________   INITIAL IMPRESSION / ASSESSMENT AND PLAN / ED COURSE  83 y.o. male with a history of  paroxysmal atrial fibrillation, CHF (EF of 55-65% on September 2017), dementia, CAD status post CABG, diabetes who presents for evaluation of shortness of breath, bilateral lower extremity edema and weight gain in spite of having his Lasix dose doubled by his son for the last few days. patient has increased work of breathing, hypoxia requiring 4 L nasal cannula, audible wheezes, 2+ pitting edema and elevated JVD consistent with CHF exacerbation. chest x-ray concerning for CHF with left pleural effusion. BNP is elevated at 560. Troponin elevated at 0.05. Patient was given 40 mg of IV Lasix and will be admitted to the hospitalist service for acute hypoxic respiratory failure in the setting of a CHF exacerbation.      As part of my medical decision making, I reviewed the following data within the electronic MEDICAL RECORD NUMBER History obtained from family, Nursing notes reviewed and incorporated, Labs reviewed , EKG interpreted , Old chart reviewed, Radiograph reviewed , Discussed with admitting physician , Notes from prior ED visits and [place] Controlled Substance Database    Pertinent labs & imaging results that were available during my care of the patient were reviewed by me and considered in my medical decision making (see chart for details).    ____________________________________________   FINAL CLINICAL IMPRESSION(S) / ED DIAGNOSES  Final diagnoses:  Acute respiratory failure with hypoxia (HCC)  Acute on chronic congestive heart failure, unspecified heart failure type (HCC)  Atrial fibrillation, unspecified type (HCC)      NEW MEDICATIONS STARTED DURING THIS VISIT:  New Prescriptions   No medications on file     Note:  This document was prepared using Dragon voice recognition software and may include unintentional dictation errors.    Nita SickleVeronese, [place], MD 02/18/17 [phone]

## 2017-02-18 NOTE — ED Triage Notes (Signed)
[facility] Complaining of shortness of breath since Sunday. Also reports non-productive cough starting last night. Patient's room air saturation at urgent care 77%. Patient denies home oxygen use. Patient's son reports history of CHF and reports recent weight gain of 4 pounds. States he has doubled patient's lasix since Sunday. Denies fever.

## 2017-02-18 NOTE — Progress Notes (Signed)
   02/18/17 2000  Clinical Encounter Type  Visited With Patient and family together  Visit Type Initial;Spiritual support  Referral From Nurse  Spiritual Encounters  Spiritual Needs Prayer;Emotional   PT request for prayer:  CH responded to order for patient prayer request; Ch prayed with patient and family at bedside offering spiritual and emotional support.

## 2017-02-19 ENCOUNTER — Inpatient Hospital Stay: Payer: Medicare Other

## 2017-02-19 LAB — CBC
HCT: 44.3 % (ref 40.0–52.0)
Hemoglobin: 14.4 g/dL (ref 13.0–18.0)
MCH: 31.6 pg (ref 26.0–34.0)
MCHC: 32.5 g/dL (ref 32.0–36.0)
MCV: 97.3 fL (ref 80.0–100.0)
PLATELETS: 168 10*3/uL (ref 150–440)
RBC: 4.55 MIL/uL (ref 4.40–5.90)
RDW: 15.1 % — AB (ref 11.5–14.5)
WBC: 5.7 10*3/uL (ref 3.8–10.6)

## 2017-02-19 LAB — BASIC METABOLIC PANEL
Anion gap: 9 (ref 5–15)
BUN: 20 mg/dL (ref 6–20)
CHLORIDE: 98 mmol/L — AB (ref 101–111)
CO2: 34 mmol/L — ABNORMAL HIGH (ref 22–32)
CREATININE: 1.12 mg/dL (ref 0.61–1.24)
Calcium: 9.5 mg/dL (ref 8.9–10.3)
GFR calc Af Amer: >60 mL/min (ref >60)
GFR calc non Af Amer: 58 mL/min — ABNORMAL LOW (ref >60)
Glucose, Bld: 155 mg/dL — ABNORMAL HIGH (ref 65–99)
Potassium: 3.7 mmol/L (ref 3.5–5.1)
SODIUM: 141 mmol/L (ref 135–145)

## 2017-02-19 LAB — ECHOCARDIOGRAM COMPLETE
Height: 70 in
Weight: 3488 oz

## 2017-02-19 LAB — TROPONIN I: Troponin I: 0.06 ng/mL (ref <0.03)

## 2017-02-19 LAB — GLUCOSE, CAPILLARY
GLUCOSE-CAPILLARY: 143 mg/dL — AB (ref 65–99)
Glucose-Capillary: 137 mg/dL — ABNORMAL HIGH (ref 65–99)
Glucose-Capillary: 150 mg/dL — ABNORMAL HIGH (ref 65–99)
Glucose-Capillary: 88 mg/dL (ref 65–99)
Glucose-Capillary: 85 mg/dL (ref 65–99)

## 2017-02-19 MED ORDER — APIXABAN 5 MG PO TABS
5.0000 mg | ORAL_TABLET | Freq: Two times a day (BID) | ORAL | Status: DC
  Administered 2017-02-19 – 2017-02-20 (×2): 5 mg via ORAL
  Filled 2017-02-19 (×2): qty 1

## 2017-02-19 MED ORDER — ALBUTEROL SULFATE (2.5 MG/3ML) 0.083% IN NEBU
2.5000 mg | INHALATION_SOLUTION | Freq: Four times a day (QID) | RESPIRATORY_TRACT | Status: DC | PRN
  Administered 2017-02-19: 2.5 mg via RESPIRATORY_TRACT
  Filled 2017-02-19: qty 3

## 2017-02-19 NOTE — Progress Notes (Signed)
[facility] [place] [facility]   PATIENT NAME: Aaron [name]    MR#:  [id number]  DATE OF BIRTH:  04-08-1930  Patient has underlying dementia. son and wife in the room. Patient states he is doing well.  Ate  good breakfast  REVIEW OF SYSTEMS:   Review of Systems  Constitutional: Negative for chills, fever and weight loss.  HENT: Negative for ear discharge, ear pain and nosebleeds.   Eyes: Negative for blurred vision, pain and discharge.  Respiratory: Positive for shortness of breath. Negative for sputum production, wheezing and stridor.   Cardiovascular: Positive for leg swelling. Negative for chest pain, palpitations, orthopnea and PND.  Gastrointestinal: Negative for abdominal pain, diarrhea, nausea and vomiting.  Genitourinary: Negative for frequency and urgency.  Musculoskeletal: Negative for back pain and joint pain.  Neurological: Positive for weakness. Negative for sensory change, speech change and focal weakness.  Psychiatric/Behavioral: Negative for depression and hallucinations. The patient is not nervous/anxious.    Tolerating Diet:yes Tolerating PT: pending  DRUG ALLERGIES:   Allergies  Allergen Reactions  . Sulfa Antibiotics Itching    VITALS:  Blood pressure (!) 122/106, pulse 67, temperature 98.2 F (36.8 C), resp. rate 20, height 5\' 10"  (1.778 m), weight 95 kg (209 lb 6.4 oz), SpO2 98 %.  PHYSICAL EXAMINATION:   Physical Exam  GENERAL:  83 y.o.-year-old patient lying in the bed with no acute distress. obses EYES: Pupils equal, round, reactive to light and accommodation. No scleral icterus. Extraocular muscles intact.  HEENT: Head atraumatic, normocephalic. Oropharynx and nasopharynx clear.  NECK:  Supple, no jugular venous distention. No thyroid enlargement, no tenderness.  LUNGS: Normal breath sounds bilaterally, no wheezing, + rales,no rhonchi. No use of accessory muscles of respiration.  CARDIOVASCULAR: S1, S2 normal. No  murmurs, rubs, or gallops.  ABDOMEN: Soft, nontender, nondistended. Bowel sounds present. No organomegaly or mass.  EXTREMITIES: No cyanosis, clubbing  -++ edema b/l.    NEUROLOGIC: Cranial nerves II through XII are intact. No focal Motor or sensory deficits b/l.   PSYCHIATRIC:  patient is alert and awake, has baseline dementia SKIN: No obvious rash, lesion, or ulcer.   LABORATORY PANEL:  CBC  Recent Labs Lab 02/19/17 0335  WBC 5.7  HGB 14.4  HCT 44.3  PLT 168    Chemistries   Recent Labs Lab 02/19/17 0335  NA 141  K 3.7  CL 98*  CO2 34*  GLUCOSE 155*  BUN 20  CREATININE 1.12  CALCIUM 9.5   Cardiac Enzymes  Recent Labs Lab 02/18/17 2331  TROPONINI 0.06*   RADIOLOGY:  Dg Chest 2 View  Result Date: 02/19/2017 CLINICAL DATA:  CHF EXAM: CHEST  2 VIEW COMPARISON:  Yesterday FINDINGS: Low lung volumes that are stable. Diffuse interstitial opacity consistent with CHF. There is superimposed streaky opacities from atelectasis. Small pleural effusions and fissure thickening. Chronic cardiomegaly. Prior median sternotomy with history of CABG. IMPRESSION: CHF that is unchanged from yesterday. Electronically Signed   By: Marnee Spring M.D.   On: 02/19/2017 07:25   Dg Chest Portable 1 View  Result Date: 02/18/2017 CLINICAL DATA:  Shortness of breath since 02/13/2017. Cough beginning last night. EXAM: PORTABLE CHEST 1 VIEW COMPARISON:  PA and lateral chest 01/11/2016. FINDINGS: There is cardiomegaly and pulmonary edema. Small left pleural effusion and basilar atelectasis are noted. The patient is status post CABG. Aortic atherosclerosis is seen. IMPRESSION: Congestive heart failure with associated small left pleural effusion. Atherosclerosis. Electronically Signed   By: Maisie Fus  Dalessio M.D.   On: 02/18/2017 13:33   ASSESSMENT AND PLAN:  Caroline SaugerRalph Ma  is a 83 y.o. male with a known history of CHF, CAD- bypass, Dementia, DM- Had SOB and edema or last 1-2 days, went to Dr. Bethann PunchesMark  [facility] where Noted to have hypoxia and CHF.  * Ac hypoxic respiratory failure due to Acute diastolic CHF -IV lasix 20 mg tid---good uop   I/o monitor, Fluid restriction   Cardio consult  With Dr Juliann Paresallwood -echo pending  * DM   Cont home meds, Keep on ISS.  * Htn   Cont home meds  * Hyperlipidemia   Cont atorvastatin.  * dementia   At baseline.  *Generalized weakness and deconditioning -PT to see pt.  Case discussed with Care Management/Social Worker. Management plans discussed with the patient, family and they are in agreement.  CODE STATUS: DNR  DVT Prophylaxis: lovenox  TOTAL TIME TAKING CARE OF THIS PATIENT: *30* minutes.  >50% time spent on counselling and coordination of care  POSSIBLE D/C IN *1-2* DAYS, DEPENDING ON CLINICAL CONDITION.  Note: This dictation was prepared with Dragon dictation along with smaller phrase technology. Any transcriptional errors that result from this process are unintentional.  Koral Thaden M.D on 02/19/2017 at 12:50 PM  Between 7am to 6pm - Pager - [phone]  After 6pm go to [email or web address] - Social research officer, governmentpassword [email or web address] [email or web address]  [facility]  Office  [phone]  CC: Primary care physician; Danella PentonMiller, Mark F, MD

## 2017-02-19 NOTE — Evaluation (Signed)
Physical Therapy Evaluation Patient Details Name: Aaron [name] MRN: [id number] DOB: [birth year] Today's Date: 02/19/2017   History of Present Illness  Pt is an 83 y.o.malewith a known history of CHF, CAD bypass, Dementia, and DM. Pt with SOB and edema or last 1-2 days, went to Dr. Bethann PunchesMark Miller where noted to have hypoxia and CHF, so sent to ER.  Assessment includes: acute hypoxic respiratory failure due to acute CHF, DM, HTN, HLD, and dementia.     Clinical Impression  Pt presented with very good functional strength with good speed and effort with bed mobility and transfer tasks.  Pt able to easily amb 100' with RW and SBA with good stability with distance limited by this PT to assess vital signs with SpO2 and HR WNL throughout and no adverse symptoms reported by pt.  Pt's spouse reports pt at or near baseline functionally and agrees with no further skilled PT services at this time.  Will complete PT orders at this time but will reassess pt pending a change in status upon receipt of new PT orders.      Follow Up Recommendations No PT follow up;Supervision/Assistance - 24 hour;Other (comment) (Recommended to pt and spouse for pt to amb with RW and SBA in home compared to baseline of HHA secondary to spouse reporting history of several near falls with HHA)    Equipment Recommendations  None recommended by PT    Recommendations for Other Services       Precautions / Restrictions Precautions Precautions: Fall Restrictions Weight Bearing Restrictions: No      Mobility  Bed Mobility Overal bed mobility: Independent             General bed mobility comments: Good speed and effort with all bed mobility tasks  Transfers Overall transfer level: Needs assistance Equipment used: Rolling walker (2 wheeled) Transfers: Sit to/from Stand Sit to Stand: Supervision         General transfer comment: Good speed and control during sit to/from stand with good stability upon initial  stand  Ambulation/Gait Ambulation/Gait assistance: Supervision Ambulation Distance (Feet): 100 Feet Assistive device: Rolling walker (2 wheeled) Gait Pattern/deviations: WFL(Within Functional Limits)   Gait velocity interpretation: at or above normal speed for age/gender General Gait Details: Good stability, cadence, and B step length during amb with SpO2 on 3LO2/min >/= 96% and appropriate HR response to activity throughout; pt reports no adverse symptoms during session.    Stairs            Wheelchair Mobility    Modified Rankin (Stroke Patients Only)       Balance Overall balance assessment: Needs assistance Sitting-balance support: Feet unsupported;Feet supported;No upper extremity supported;Bilateral upper extremity supported Sitting balance-Leahy Scale: Normal     Standing balance support: Bilateral upper extremity supported Standing balance-Leahy Scale: Good                               Pertinent Vitals/Pain Pain Assessment: No/denies pain    Home Living Family/patient expects to be discharged to:: Private residence (History obtained from spouse at bedside secondary to pt's cognitive deficits) Living Arrangements: Spouse/significant other;Children Available Help at Discharge: Family;Available 24 hours/day;Other (Comment) ([facility] [place] most of the day for exercise and other activities) Type of Home: House Home Access: Level entry     Home Layout: Two level;Able to live on main level with bedroom/bathroom Home Equipment: Dan HumphreysWalker - 2 wheels;Cane -  single point      Prior Function Level of Independence: Needs assistance   Gait / Transfers Assistance Needed: HHA with family without fall history but occasionally unsteady per spouse  ADL's / Homemaking Assistance Needed: Assistance from family with ADLs        Hand Dominance        Extremity/Trunk Assessment   Upper Extremity Assessment Upper Extremity Assessment:  Overall WFL for tasks assessed    Lower Extremity Assessment Lower Extremity Assessment: Overall WFL for tasks assessed       Communication   Communication: No difficulties  Cognition Arousal/Alertness: Awake/alert Behavior During Therapy: WFL for tasks assessed/performed Overall Cognitive Status: History of cognitive impairments - at baseline                                        General Comments      Exercises Total Joint Exercises Ankle Circles/Pumps: AROM;Both;5 reps;10 reps Quad Sets: Strengthening;Both;10 reps Hip ABduction/ADduction: AROM;Both;10 reps Long Arc Quad: AROM;Both;10 reps;15 reps Knee Flexion: AROM;Both;10 reps;15 reps Marching in Standing: AROM;Both;10 reps;5 reps Other Exercises Other Exercises: Vital signs several times during therex with SpO2 and HR WNL throughout   Assessment/Plan    PT Assessment Patent does not need any further PT services  PT Problem List         PT Treatment Interventions      PT Goals (Current goals can be found in the Care Plan section)  Acute Rehab PT Goals PT Goal Formulation: All assessment and education complete, DC therapy    Frequency     Barriers to discharge        Co-evaluation               AM-PAC PT "6 Clicks" Daily Activity  Outcome Measure Difficulty turning over in bed (including adjusting bedclothes, sheets and blankets)?: None Difficulty moving from lying on back to sitting on the side of the bed? : None Difficulty sitting down on and standing up from a chair with arms (e.g., wheelchair, bedside commode, etc,.)?: None Help needed moving to and from a bed to chair (including a wheelchair)?: None Help needed walking in hospital room?: A Little Help needed climbing 3-5 steps with a railing? : A Little 6 Click Score: 22    End of Session Equipment Utilized During Treatment: Gait belt;Oxygen Activity Tolerance: Patient tolerated treatment well Patient left: in bed;with  family/visitor present;with call bell/phone within reach;Other (comment) (Bed alarm error, could not set) Nurse Communication: Mobility status;Other (comment) (Bed alarm error) PT Visit Diagnosis: Muscle weakness (generalized) (M62.81)    Time: 1610-9604 PT Time Calculation (min) (ACUTE ONLY): 32 min   Charges:   PT Evaluation $PT Eval Low Complexity: 1 Low PT Treatments $Therapeutic Exercise: 8-22 mins   PT G Codes:   PT G-Codes **NOT FOR INPATIENT CLASS** Functional Assessment Tool Used: AM-PAC 6 Clicks Basic Mobility Functional Limitation: Mobility: Walking and moving around Mobility: Walking and Moving Around Current Status (V4098): At least 20 percent but less than 40 percent impaired, limited or restricted Mobility: Walking and Moving Around Goal Status [phone]): At least 20 percent but less than 40 percent impaired, limited or restricted Mobility: Walking and Moving Around Discharge Status [phone]): At least 20 percent but less than 40 percent impaired, limited or restricted    D. Scott Emiel Kielty PT, DPT 02/19/17, 2:20 PM

## 2017-02-19 NOTE — Progress Notes (Signed)
Pt has an audible wheeze. MD notified. Orders for albuterol nebs received. I will continue to assess.

## 2017-02-19 NOTE — Consult Note (Signed)
Reason for Consult: Congestive heart failure shortness of breath hypoxemia Referring Physician: Dr. Myrtis Ser primary, Dr Anselm Jungling hospitalist Cardiologist Dr. Johnnette Litter Aaron [name] is an 83 y.o. male.  HPI: Patient's 83 year old white male with known coronary disease diastolic congestive heart failure dementia diabetes is an with worsening shortness of breath and edema over the last 48 hours. Patient was seen in primary doctor's office and was found to have a hypoxemia patient was then sent to the emergency room for further assessment and evaluation. In the emergency room congestive heart failure was confirmed patient was treated with diuretic therapy and admitted with supplemental oxygen. Because of his dementia history is difficult. He denies any significant chest pain  Past Medical History:  Diagnosis Date  . CHF (congestive heart failure) ([place])   . Coronary artery disease with history of myocardial infarction without history of CABG   . Dementia   . Diabetes mellitus without complication [facility])     Past Surgical History:  Procedure Laterality Date  . CORONARY ARTERY BYPASS GRAFT      Family History  Problem Relation Age of Onset  . Hypertension Mother     Social History:  reports that he has never smoked. He has never used smokeless tobacco. He reports that he does not drink alcohol. His drug history is not on file.  Allergies:  Allergies  Allergen Reactions  . Sulfa Antibiotics Itching    Medications: I have reviewed the patient's current medications.  Results for orders placed or performed during the hospital encounter of 02/18/17 (from the past 48 hour(s))  Basic metabolic panel     Status: Abnormal   Collection Time: 02/18/17 12:28 PM  Result Value Ref Range   Sodium 138 135 - 145 mmol/L   Potassium 4.0 3.5 - 5.1 mmol/L   Chloride 97 (L) 101 - 111 mmol/L   CO2 30 22 - 32 mmol/L   Glucose, Bld 241 (H) 65 - 99 mg/dL   BUN 19 6 - 20 mg/dL   Creatinine, Ser 1.16  0.61 - 1.24 mg/dL   Calcium 9.9 8.9 - 10.3 mg/dL   GFR calc non Af Amer 56 (L) >60 mL/min   GFR calc Af Amer >60 >60 mL/min    Comment: (NOTE) The eGFR has been calculated using the CKD EPI equation. This calculation has not been validated in all clinical situations. eGFR's persistently <60 mL/min signify possible Chronic Kidney Disease.    Anion gap 11 5 - 15  CBC     Status: Abnormal   Collection Time: 02/18/17 12:28 PM  Result Value Ref Range   WBC 8.2 3.8 - 10.6 K/uL   RBC 4.65 4.40 - 5.90 MIL/uL   Hemoglobin 14.5 13.0 - 18.0 g/dL   HCT 45.2 40.0 - 52.0 %   MCV 97.1 80.0 - 100.0 fL   MCH 31.3 26.0 - 34.0 pg   MCHC 32.2 32.0 - 36.0 g/dL   RDW 15.2 (H) 11.5 - 14.5 %   Platelets 178 150 - 440 K/uL  Troponin I     Status: Abnormal   Collection Time: 02/18/17 12:28 PM  Result Value Ref Range   Troponin I 0.05 (HH) <0.03 ng/mL    Comment: CRITICAL RESULT CALLED TO, READ BACK BY AND VERIFIED WITH SHANNON HATCH AT 1322 ON 02/18/17 BY SNJ   Brain natriuretic peptide     Status: Abnormal   Collection Time: 02/18/17 12:28 PM  Result Value Ref Range   B Natriuretic Peptide 560.0 (H) 0.0 -  100.0 pg/mL  Troponin I     Status: Abnormal   Collection Time: 02/18/17  2:59 PM  Result Value Ref Range   Troponin I 0.06 (HH) <0.03 ng/mL    Comment: CRITICAL VALUE NOTED. VALUE IS CONSISTENT WITH PREVIOUSLY REPORTED/CALLED VALUE / [place]  Troponin I     Status: Abnormal   Collection Time: 02/18/17  6:54 PM  Result Value Ref Range   Troponin I 0.06 (HH) <0.03 ng/mL    Comment: CRITICAL VALUE NOTED. VALUE IS CONSISTENT WITH PREVIOUSLY REPORTED/CALLED VALUE / [place]  Troponin I     Status: Abnormal   Collection Time: 02/18/17 11:31 PM  Result Value Ref Range   Troponin I 0.06 (HH) <0.03 ng/mL    Comment: CRITICAL VALUE NOTED. VALUE IS CONSISTENT WITH PREVIOUSLY REPORTED/CALLED VALUE / [place]  Basic metabolic panel     Status: Abnormal   Collection Time: 02/19/17  3:35 AM  Result Value Ref Range    Sodium 141 135 - 145 mmol/L   Potassium 3.7 3.5 - 5.1 mmol/L   Chloride 98 (L) 101 - 111 mmol/L   CO2 34 (H) 22 - 32 mmol/L   Glucose, Bld 155 (H) 65 - 99 mg/dL   BUN 20 6 - 20 mg/dL   Creatinine, Ser 1.12 0.61 - 1.24 mg/dL   Calcium 9.5 8.9 - 10.3 mg/dL   GFR calc non Af Amer 58 (L) >60 mL/min   GFR calc Af Amer >60 >60 mL/min    Comment: (NOTE) The eGFR has been calculated using the CKD EPI equation. This calculation has not been validated in all clinical situations. eGFR's persistently <60 mL/min signify possible Chronic Kidney Disease.    Anion gap 9 5 - 15  CBC     Status: Abnormal   Collection Time: 02/19/17  3:35 AM  Result Value Ref Range   WBC 5.7 3.8 - 10.6 K/uL   RBC 4.55 4.40 - 5.90 MIL/uL   Hemoglobin 14.4 13.0 - 18.0 g/dL   HCT 44.3 40.0 - 52.0 %   MCV 97.3 80.0 - 100.0 fL   MCH 31.6 26.0 - 34.0 pg   MCHC 32.5 32.0 - 36.0 g/dL   RDW 15.1 (H) 11.5 - 14.5 %   Platelets 168 150 - 440 K/uL  Glucose, capillary     Status: Abnormal   Collection Time: 02/19/17  4:18 AM  Result Value Ref Range   Glucose-Capillary 137 (H) 65 - 99 mg/dL  Glucose, capillary     Status: Abnormal   Collection Time: 02/19/17  8:03 AM  Result Value Ref Range   Glucose-Capillary 150 (H) 65 - 99 mg/dL  Glucose, capillary     Status: Abnormal   Collection Time: 02/19/17 11:11 AM  Result Value Ref Range   Glucose-Capillary 143 (H) 65 - 99 mg/dL    Dg Chest 2 View  Result Date: 02/19/2017 CLINICAL DATA:  CHF EXAM: CHEST  2 VIEW COMPARISON:  Yesterday FINDINGS: Low lung volumes that are stable. Diffuse interstitial opacity consistent with CHF. There is superimposed streaky opacities from atelectasis. Small pleural effusions and fissure thickening. Chronic cardiomegaly. Prior median sternotomy with history of CABG. IMPRESSION: CHF that is unchanged from yesterday. Electronically Signed   By: Monte Fantasia M.D.   On: 02/19/2017 07:25   Dg Chest Portable 1 View  Result Date:  02/18/2017 CLINICAL DATA:  Shortness of breath since 02/13/2017. Cough beginning last night. EXAM: PORTABLE CHEST 1 VIEW COMPARISON:  PA and lateral chest 01/11/2016. FINDINGS: There is cardiomegaly  and pulmonary edema. Small left pleural effusion and basilar atelectasis are noted. The patient is status post CABG. Aortic atherosclerosis is seen. IMPRESSION: Congestive heart failure with associated small left pleural effusion. Atherosclerosis. Electronically Signed   By: Inge Rise M.D.   On: 02/18/2017 13:33    Review of Systems  Unable to perform ROS: Dementia   Blood pressure (!) 122/106, pulse 67, temperature 98.2 F (36.8 C), resp. rate 20, height 5' 10"  (1.778 m), weight 209 lb 6.4 oz (95 kg), SpO2 98 %. Physical Exam  Nursing note and vitals reviewed. Constitutional: He appears well-developed and well-nourished.  HENT:  Head: Normocephalic and atraumatic.  Eyes: Pupils are equal, round, and reactive to light. Conjunctivae and EOM are normal.  Neck: Normal range of motion. Neck supple.  Cardiovascular: Normal rate and regular rhythm.   Murmur heard. Respiratory: Effort normal.  GI: Soft. Bowel sounds are normal.  Musculoskeletal: Normal range of motion. He exhibits edema.  Neurological: He is alert.  Skin: Skin is warm and dry.    Assessment/Plan: Congestive heart failure Shortness of breath Coronary artery disease Dementia Diabetes type 2 Borderline troponins Hypoxic respiratory failure resolved Diastolic heart failure Hyperlipidemia . Plan Agree with admission for congestive heart failure Continue supplemental oxygen as necessary Agree with IV diuretic therapy Follow-up troponins Follow-up EKGs Recommend medical therapy at this point Continue lipid management with Lipitor Agree with dementia management Continue glipizide metformin for diabetes Have patient follow-up with Dr. Nehemiah Massed as an outpatient  Dwayne D Callwood 02/19/2017, 1:09 PM

## 2017-02-19 NOTE — Progress Notes (Addendum)
Call fromCCMD that heart rate went down to the 40-50s, non-sustained. Md notified.Came to check on patient. Pt sleeping, easy to arouse. No complaints. Heart rate currently 58. Will continue to monitor.

## 2017-02-19 NOTE — Discharge Instructions (Signed)
Heart Failure Clinic appointment on February 26 2017 at 11:00am with Aaron Kindredina Waverley Krempasky, FNP. Please call [phone] to reschedule.

## 2017-02-20 LAB — GLUCOSE, CAPILLARY
GLUCOSE-CAPILLARY: 100 mg/dL — AB (ref 65–99)
GLUCOSE-CAPILLARY: 169 mg/dL — AB (ref 65–99)

## 2017-02-20 MED ORDER — FUROSEMIDE 40 MG PO TABS
40.0000 mg | ORAL_TABLET | Freq: Every day | ORAL | Status: DC
  Filled 2017-02-20: qty 1

## 2017-02-20 MED ORDER — ALBUTEROL SULFATE (2.5 MG/3ML) 0.083% IN NEBU: 2.5000 mg | INHALATION_SOLUTION | RESPIRATORY_TRACT | Status: DC | PRN

## 2017-02-20 MED ORDER — APIXABAN 5 MG PO TABS: 5.0000 mg | ORAL_TABLET | Freq: Two times a day (BID) | ORAL | 1 refills | Status: AC

## 2017-02-20 MED ORDER — ALBUTEROL SULFATE HFA 108 (90 BASE) MCG/ACT IN AERS: 1.0000 | INHALATION_SPRAY | Freq: Four times a day (QID) | RESPIRATORY_TRACT | 0 refills | Status: AC | PRN

## 2017-02-20 MED ORDER — ALBUTEROL SULFATE HFA 108 (90 BASE) MCG/ACT IN AERS: 1.0000 | INHALATION_SPRAY | Freq: Four times a day (QID) | RESPIRATORY_TRACT | Status: DC | PRN

## 2017-02-20 MED ORDER — FUROSEMIDE 20 MG PO TABS: 20.0000 mg | ORAL_TABLET | Freq: Every day | ORAL | Status: DC

## 2017-02-20 MED ORDER — FUROSEMIDE 20 MG PO TABS: 40.0000 mg | ORAL_TABLET | Freq: Every day | ORAL | 1 refills | Status: AC

## 2017-02-20 MED ORDER — ALBUTEROL SULFATE HFA 108 (90 BASE) MCG/ACT IN AERS: 1.0000 | INHALATION_SPRAY | RESPIRATORY_TRACT | Status: DC | PRN

## 2017-02-20 MED ORDER — FUROSEMIDE 10 MG/ML IJ SOLN
20.0000 mg | Freq: Once | INTRAMUSCULAR | Status: AC
  Administered 2017-02-20: 20 mg via INTRAVENOUS
  Filled 2017-02-20: qty 2

## 2017-02-20 NOTE — Progress Notes (Signed)
SATURATION QUALIFICATIONS: (This note is used to comply with regulatory documentation for home oxygen)  Patient Saturations on Room Air at Rest = 86%  Patient Saturations on Room Air while Ambulating = n/a%  Patient Saturations on  Liters of oxygen while Ambulating = n/a%  Please briefly explain why patient needs home oxygen: patients <90% on room air at rest,.

## 2017-02-20 NOTE — Care Management Important Message (Signed)
Important Message  Patient Details  Name: Aaron SaugerRalph [name] MRN: [id number] Date of Birth: 05/29/1929   Medicare Important Message Given:  N/A - LOS <3 / Initial given by admissions    Eber HongGreene, Frayda Egley R, RN 02/20/2017, 9:42 AM

## 2017-02-20 NOTE — Care Management (Addendum)
Patient for discharge home with orders for home health nurse for CHF follow up.  Patient is currently wearing oxygen and this is acute.  Discussed the need to perform home oxygen assessment prior to discharge with CM notification during progression.  Patient is to discharge home on Eliquis.  Provided with Eliquis 30 day trial coupon.  Patient verbally confirms he has Medicare pharmacy policy to pay for meds.  Has access to scales at home.  No agency preference for home health.  Referral called to and accepted by [facility] as agency is in network with patients' Medicare [employer].

## 2017-02-20 NOTE — Care Management (Signed)
patient has qualified for home oxygen.  spoke with patient's son.  No agency preference.  Referral called to [facility].  Patient remains hypoxic despite IV diuretics.  [facility].  UPdated son on home health arrangements

## 2017-02-20 NOTE — Progress Notes (Signed)
IV and tele removed from patient. Discharge instructions given to patient and son. Verbalized understanding. Home oxygen has been delivered. No acute distress at this time. Husband and wife at bedside and will transport patient home.

## 2017-02-20 NOTE — Discharge Summary (Signed)
[facility] - [place] at [facility]   PATIENT NAME: Aaron SaugerRalph [name]    MR#:  [id number]  DATE OF BIRTH:  [birth year]  DATE OF ADMISSION:  02/18/2017 ADMITTING PHYSICIAN: Altamese DillingVaibhavkumar Vachhani, MD  DATE OF DISCHARGE: 02/20/17  PRIMARY CARE PHYSICIAN: Danella PentonMiller, Mark F, MD    ADMISSION DIAGNOSIS:  CHF (congestive heart failure) (HCC) [I50.9] Acute respiratory failure with hypoxia (HCC) [J96.01] Atrial fibrillation, unspecified type (HCC) [I48.91] Acute on chronic congestive heart failure, unspecified heart failure type (HCC) [I50.9]  DISCHARGE DIAGNOSIS:  Acute CHF- sytstolic, new Atrial fibrillation, new now on eliquis  SECONDARY DIAGNOSIS:   Past Medical History:  Diagnosis Date  . CHF (congestive heart failure) (HCC)   . Coronary artery disease with history of myocardial infarction without history of CABG   . Dementia   . Diabetes mellitus without complication [facility](HCC)     HOSPITAL COURSE:  RalphMearesis a 83 y.o.malewith a known history of CHF, CAD- bypass, Dementia, DM- Had SOB and edema or last 1-2 days, went to Dr. Bethann PunchesMark Miller where Noted to have hypoxia and CHF.  * Ac hypoxic respiratory failure due to Acute systolic CHF -IV lasix 20 mg tid---good uop--change to oral alsix 40 mg qd I/o monitor, Fluid restriction Cardio consult  With Dr Juliann Paresallwood appreciated -echo showed Mildly Reduced Overall LVF, Mild Global Hypo Wall Motion, EF=45-50 %,Mild/Mod dilated RV,Mild AI   Mild MR, Mod TR. No cardiac source of emboli was indentified -sats dropped to 88% on Exertion---will need home oxygen  * DM-2 Cont home meds, Keep on ISS.  * Htn Cont home meds  * Hyperlipidemia Cont atorvastatin.  * dementia At baseline.  *Generalized weakness and deconditioning -PT recommends  NO PT  HHRN to be arranged for CHF education  Above was d/w son Aaron JacobsenSam Falotico  CONSULTS OBTAINED:  Treatment Team:  Alwyn Peaallwood, Dwayne D, MD  DRUG  ALLERGIES:   Allergies  Allergen Reactions  . Sulfa Antibiotics Itching    DISCHARGE MEDICATIONS:   Current Discharge Medication List    START taking these medications   Details  albuterol (PROVENTIL HFA;VENTOLIN HFA) 108 (90 Base) MCG/ACT inhaler Inhale 1-2 puffs into the lungs every 6 (six) hours as needed for wheezing or shortness of breath. Qty: 1 Inhaler, Refills: 0    apixaban (ELIQUIS) 5 MG TABS tablet Take 1 tablet (5 mg total) by mouth 2 (two) times daily. Qty: 60 tablet, Refills: 1      CONTINUE these medications which have CHANGED   Details  furosemide (LASIX) 20 MG tablet Take 2 tablets (40 mg total) by mouth daily. Qty: 30 tablet, Refills: 1      CONTINUE these medications which have NOT CHANGED   Details  atorvastatin (LIPITOR) 20 MG tablet Take 20 mg by mouth at bedtime.    citalopram (CELEXA) 20 MG tablet Take 20 mg by mouth daily.    docusate sodium (COLACE) 100 MG capsule Take 100 mg by mouth daily.    doxazosin (CARDURA) 2 MG tablet Take 2 mg by mouth at bedtime.    finasteride (PROSCAR) 5 MG tablet Take 5 mg by mouth daily.    galantamine (RAZADYNE) 12 MG tablet Take 12 mg by mouth 2 (two) times daily with a meal.    glipiZIDE (GLUCOTROL) 10 MG tablet Take 10 mg by mouth 2 (two) times daily before a meal.    Melatonin 5 MG TABS Take 5 mg by mouth at bedtime.    memantine (NAMENDA) 10 MG tablet Take 10 mg  by mouth 2 (two) times daily.    metFORMIN (GLUCOPHAGE-XR) 500 MG 24 hr tablet Take 500-1,000 mg by mouth 2 (two) times daily. 500 mg every morning and 1000 mg at bedtime    multivitamin (ONE-A-DAY MEN'S) TABS tablet Take 1 tablet by mouth daily.    sitaGLIPtin (JANUVIA) 100 MG tablet Take 100 mg by mouth daily.    spironolactone (ALDACTONE) 25 MG tablet Take 12.5 mg by mouth daily.      STOP taking these medications     aspirin EC 81 MG tablet         If you experience worsening of your admission symptoms, develop shortness of breath,  life threatening emergency, suicidal or homicidal thoughts you must seek medical attention immediately by calling 911 or calling your MD immediately  if symptoms less severe.  You Must read complete instructions/literature along with all the possible adverse reactions/side effects for all the Medicines you take and that have been prescribed to you. Take any new Medicines after you have completely understood and accept all the possible adverse reactions/side effects.   Please note  You were cared for by a hospitalist during your hospital stay. If you have any questions about your discharge medications or the care you received while you were in the hospital after you are discharged, you can call the unit and asked to speak with the hospitalist on call if the hospitalist that took care of you is not available. Once you are discharged, your primary care physician will handle any further medical issues. Please note that NO REFILLS for any discharge medications will be authorized once you are discharged, as it is imperative that you return to your primary care physician (or establish a relationship with a primary care physician if you do not have one) for your aftercare needs so that they can reassess your need for medications and monitor your lab values. Today   SUBJECTIVE  No new complaints  VITAL SIGNS:  Blood pressure (!) 146/58, pulse (!) 57, temperature 98 F (36.7 C), temperature source Oral, resp. rate 20, height 5\' 10"  (1.778 m), weight 93.5 kg (206 lb 3.2 oz), SpO2 97 %.  I/O:   Intake/Output Summary (Last 24 hours) at 02/20/17 0907 Last data filed at 02/20/17 0859  Gross per 24 hour  Intake              720 ml  Output             1500 ml  Net             -780 ml    PHYSICAL EXAMINATION:  GENERAL:  83 y.o.-year-old patient lying in the bed with no acute distress.  EYES: Pupils equal, round, reactive to light and accommodation. No scleral icterus. Extraocular muscles intact.  HEENT:  Head atraumatic, normocephalic. Oropharynx and nasopharynx clear.  NECK:  Supple, no jugular venous distention. No thyroid enlargement, no tenderness.  LUNGS: Normal breath sounds bilaterally, scattered wheezing,no  rales,rhonchi or crepitation. No use of accessory muscles of respiration.  CARDIOVASCULAR: S1, S2 normal. No murmurs, rubs, or gallops.  ABDOMEN: Soft, non-tender, non-distended. Bowel sounds present. No organomegaly or mass.  EXTREMITIES: No pedal edema, cyanosis, or clubbing.  NEUROLOGIC: Cranial nerves II through XII are intact. Muscle strength 5/5 in all extremities. Sensation intact. Gait not checked.  PSYCHIATRIC: The patient is alert and oriented x 3.  SKIN: No obvious rash, lesion, or ulcer.   DATA REVIEW:   CBC   Recent Labs Lab 02/19/17  0335  WBC 5.7  HGB 14.4  HCT 44.3  PLT 168    Chemistries   Recent Labs Lab 02/19/17 0335  NA 141  K 3.7  CL 98*  CO2 34*  GLUCOSE 155*  BUN 20  CREATININE 1.12  CALCIUM 9.5    Microbiology Results   No results found for this or any previous visit (from the past 240 hour(s)).  RADIOLOGY:  Dg Chest 2 View  Result Date: 02/19/2017 CLINICAL DATA:  CHF EXAM: CHEST  2 VIEW COMPARISON:  Yesterday FINDINGS: Low lung volumes that are stable. Diffuse interstitial opacity consistent with CHF. There is superimposed streaky opacities from atelectasis. Small pleural effusions and fissure thickening. Chronic cardiomegaly. Prior median sternotomy with history of CABG. IMPRESSION: CHF that is unchanged from yesterday. Electronically Signed   By: Marnee Spring M.D.   On: 02/19/2017 07:25   Dg Chest Portable 1 View  Result Date: 02/18/2017 CLINICAL DATA:  Shortness of breath since 02/13/2017. Cough beginning last night. EXAM: PORTABLE CHEST 1 VIEW COMPARISON:  PA and lateral chest 01/11/2016. FINDINGS: There is cardiomegaly and pulmonary edema. Small left pleural effusion and basilar atelectasis are noted. The patient is status  post CABG. Aortic atherosclerosis is seen. IMPRESSION: Congestive heart failure with associated small left pleural effusion. Atherosclerosis. Electronically Signed   By: Drusilla Kanner M.D.   On: 02/18/2017 13:33     Management plans discussed with the patient, family and they are in agreement.  CODE STATUS:     Code Status Orders        Start     Ordered   02/18/17 1901  Do not attempt resuscitation (DNR)  Continuous    Question Answer Comment  In the event of cardiac or respiratory ARREST Do not call a "code blue"   In the event of cardiac or respiratory ARREST Do not perform Intubation, CPR, defibrillation or ACLS   In the event of cardiac or respiratory ARREST Use medication by any route, position, wound care, and other measures to relive pain and suffering. May use oxygen, suction and manual treatment of airway obstruction as needed for comfort.      02/18/17 1901    Code Status History    Date Active Date Inactive Code Status Order ID Comments User Context   01/11/2016  5:20 PM 01/12/2016  6:37 AM DNR [id number]  Milagros Loll, MD Inpatient   01/11/2016  1:07 PM 01/11/2016  5:20 PM Full Code [id number]  Milagros Loll, MD ED    Advance Directive Documentation     Most Recent Value  Type of Advance Directive  Out of facility DNR (pink MOST or yellow form)  Pre-existing out of facility DNR order (yellow form or pink MOST form)  Yellow form placed in chart (order not valid for inpatient use)  "MOST" Form in Place?  -      TOTAL TIME TAKING CARE OF THIS PATIENT: *40* minutes.    Roni Friberg M.D on 02/20/2017 at 9:07 AM  Between 7am to 6pm - Pager - [phone] After 6pm go to [email or web address] - Social research officer, government  [facility]  Office  [phone]  CC: Primary care physician; Danella Penton, MD

## 2017-02-20 NOTE — Progress Notes (Signed)
Notified by CCMD that patient had 3.26 sec pause and HR dropped to 32 non-sustained. Notified MD Vachhani and no new orders received. Patient sleeping and in NAD. Nursing staff will continue to monitor for any changes in patient status. Lamonte RicherKara A Adeja Sarratt, RN

## 2017-02-26 ENCOUNTER — Telehealth: Payer: Self-pay | Admitting: Family

## 2017-02-26 ENCOUNTER — Ambulatory Visit: Payer: Medicare Other | Admitting: Family

## 2017-02-26 NOTE — Telephone Encounter (Signed)
Patient did not show for his Heart Failure Clinic appointment on 02/26/17. Will attempt to reschedule.  

## 2017-02-26 DEATH — deceased

## 2017-09-22 IMAGING — CT CT HEAD W/O CM
3 series · 15 of 47 positions shown, 18 images · non-contrast
Comparison: None.

CLINICAL DATA: Multiple falls.  Altered mental status.

EXAM:
CT HEAD WITHOUT CONTRAST
TECHNIQUE: Contiguous axial images were obtained from the base of the skull
through the vertex without intravenous contrast.

[Series 2: head wo · axial · 0.42mm/px · z∈[-94,+31]mm · 9 of 31 slices shown, 12 images]
[im 3/31  brain]
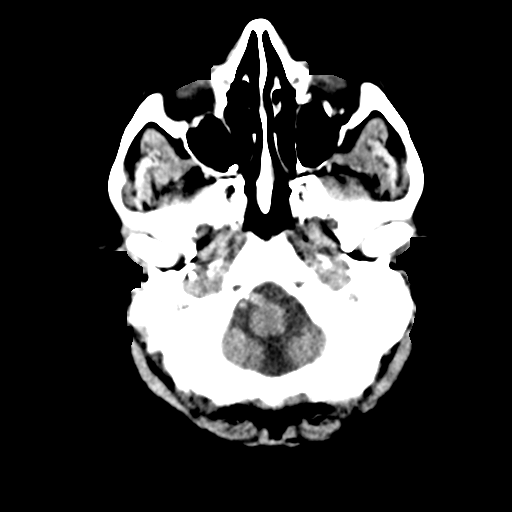
[im 3/31  bone]
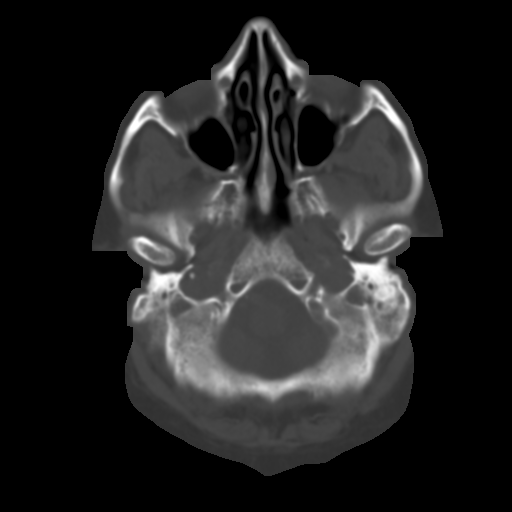
[im 6/31  brain]
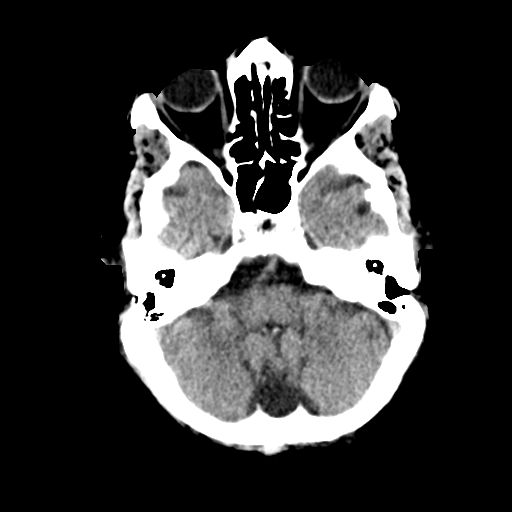
[im 9/31  brain]
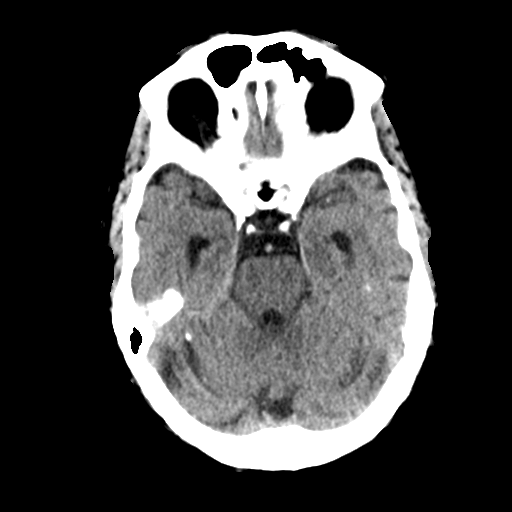
[im 12/31  brain]
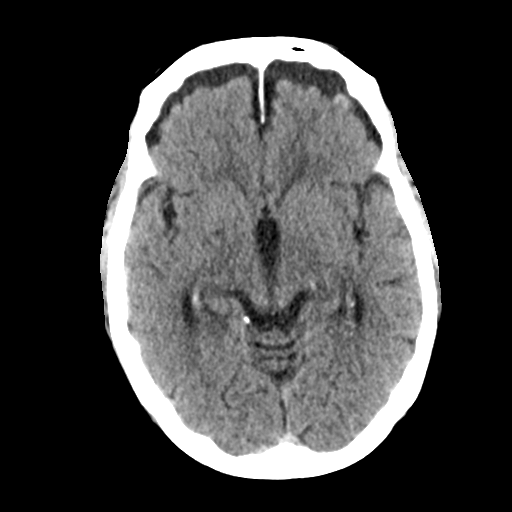
[im 16/31  brain]
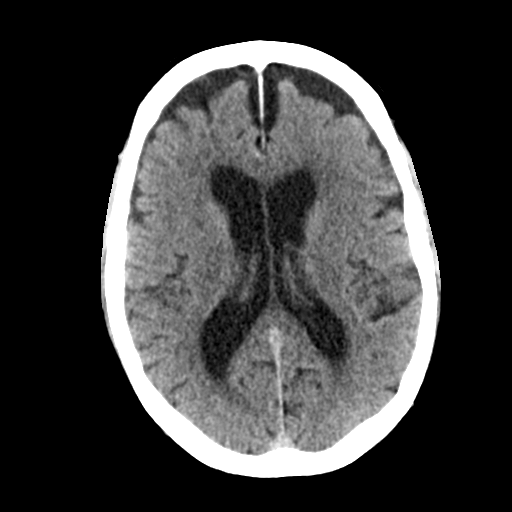
[im 16/31  bone]
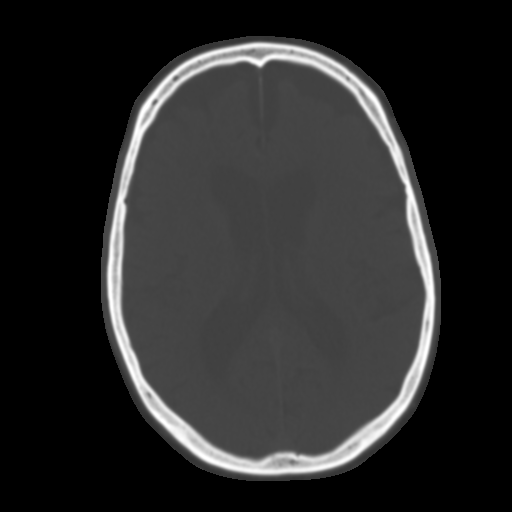
[im 19/31  brain]
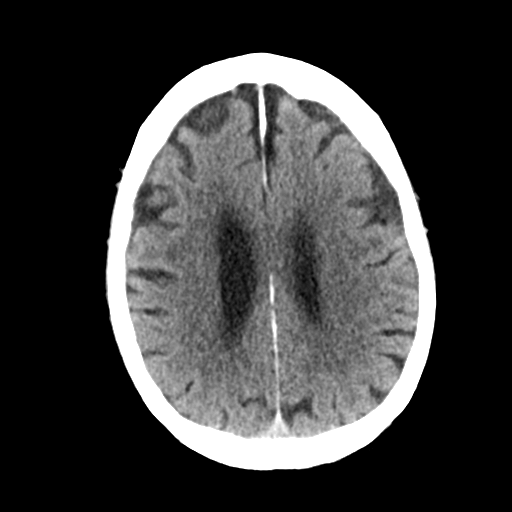
[im 22/31  brain]
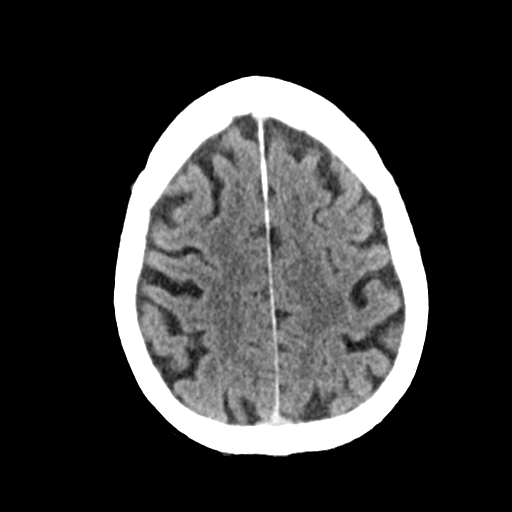
[im 25/31  brain]
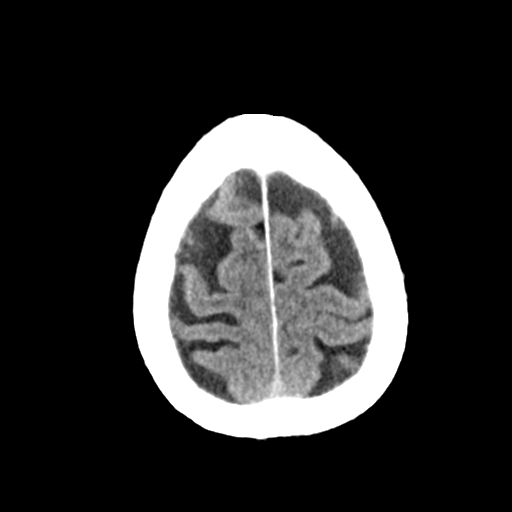
[im 28/31  brain]
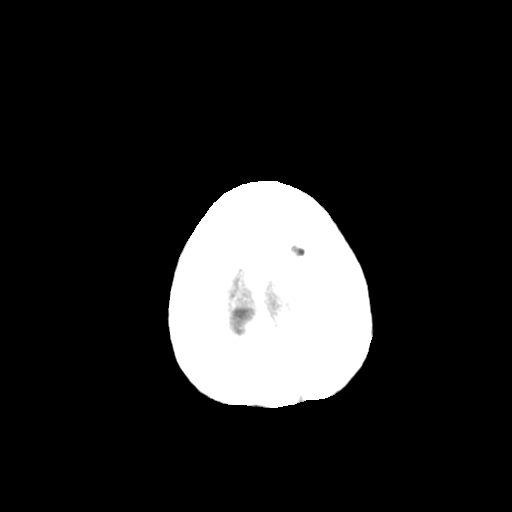
[im 28/31  bone]
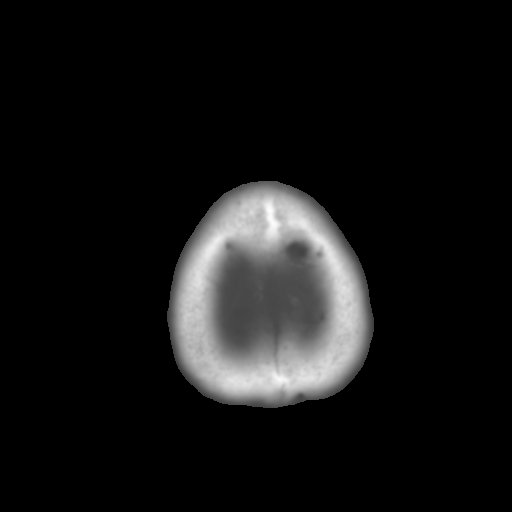

[Series 4: coronal soft tissue · coronal · 0.31mm/px · 3 of 65 slices shown]
[im 22/65  brain]
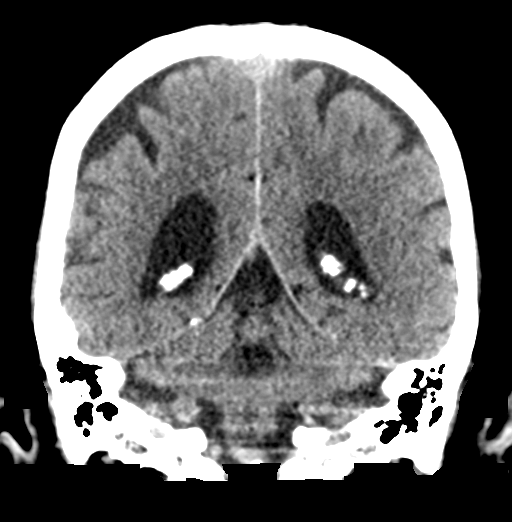
[im 29/65  brain]
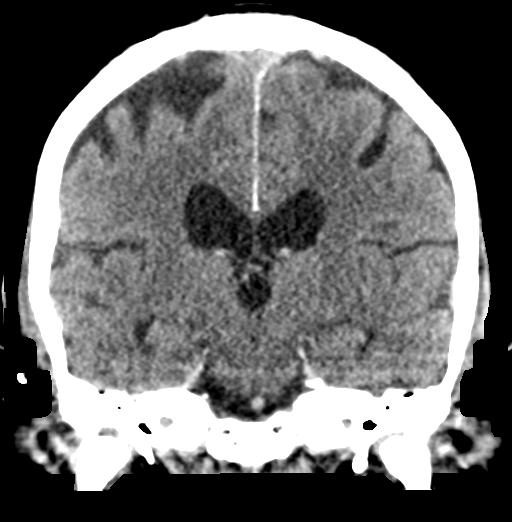
[im 36/65  brain]
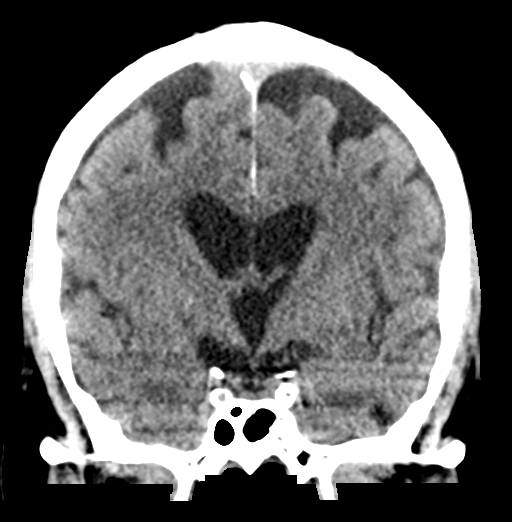

[Series 5: sagittal soft tissue · sagittal · 0.30mm/px · 3 of 53 slices shown]
[im 18/53  brain]
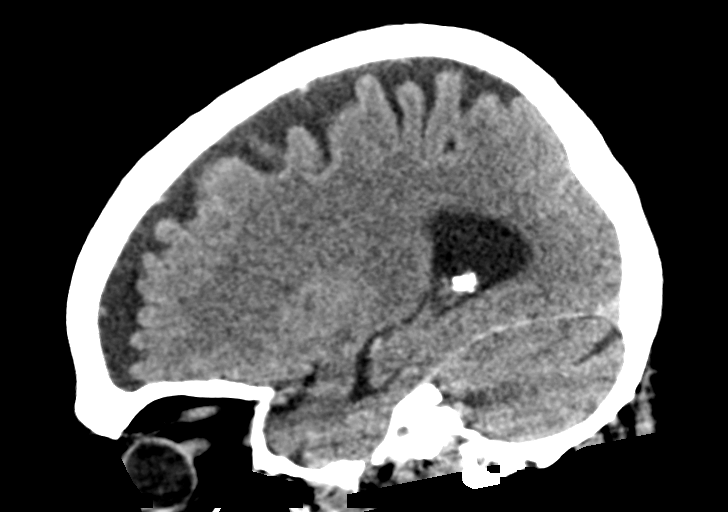
[im 27/53  brain]
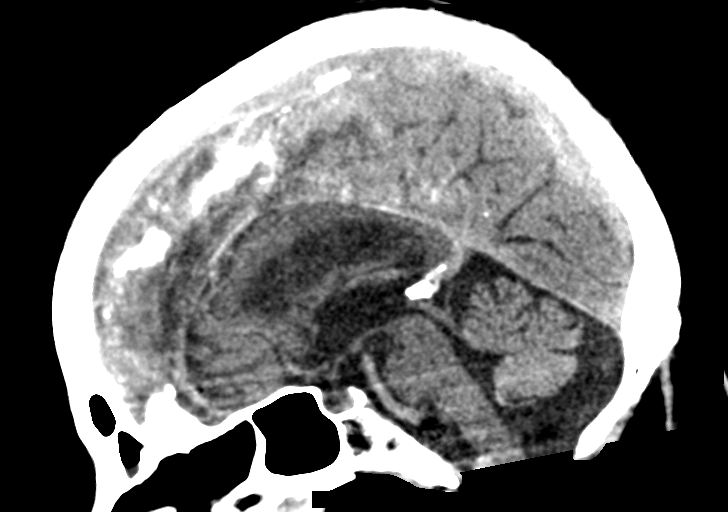
[im 35/53  brain]
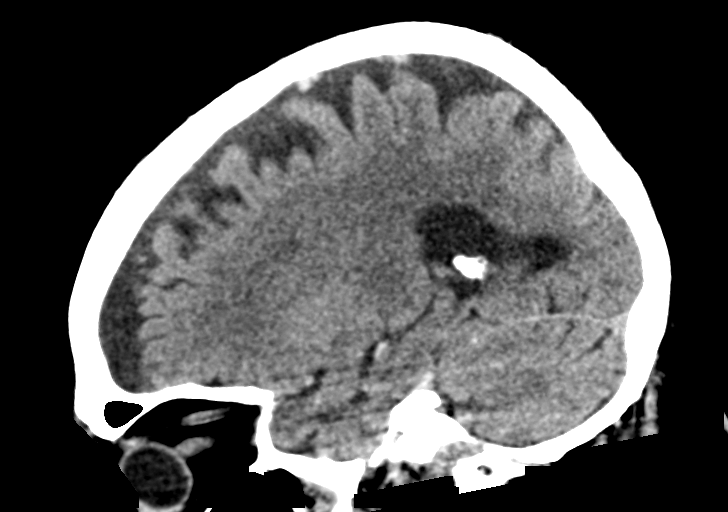

[15 of 47 positions shown; findings below may reference images not displayed]

FINDINGS: Brain: No evidence of acute infarction, hemorrhage, extra-axial
collection, ventriculomegaly, or mass effect. Generalized cerebral
atrophy. Periventricular white matter low attenuation likely
secondary to microangiopathy.

Vascular: Cerebrovascular atherosclerotic calcifications are noted.

Skull: Negative for fracture or focal lesion.

Sinuses/Orbits: Visualized portions of the orbits are unremarkable.
Visualized portions of the paranasal sinuses and mastoid air cells
are unremarkable.

Other: None.
IMPRESSION: No acute intracranial pathology.
